# Patient Record
Sex: Female | Born: 1970
Health system: Southern US, Community
[De-identification: ages and names within clinical notes are randomized; demographics above are authoritative.]

## PROBLEM LIST (undated history)

## (undated) DIAGNOSIS — E785 Hyperlipidemia, unspecified: Secondary | ICD-10-CM

## (undated) DIAGNOSIS — C801 Malignant (primary) neoplasm, unspecified: Secondary | ICD-10-CM

## (undated) DIAGNOSIS — I10 Essential (primary) hypertension: Secondary | ICD-10-CM

## (undated) HISTORY — PX: THYROIDECTOMY: SHX17

## (undated) HISTORY — DX: Hyperlipidemia, unspecified: E78.5

## (undated) HISTORY — DX: Essential (primary) hypertension: I10

## (undated) HISTORY — PX: ABDOMINAL HYSTERECTOMY: SHX81

## (undated) HISTORY — DX: Malignant (primary) neoplasm, unspecified: C80.1

## (undated) HISTORY — PX: SALPINGOOPHORECTOMY: SHX82

---

## 2000-06-13 ENCOUNTER — Emergency Department (HOSPITAL_COMMUNITY): Admission: EM | Admit: 2000-06-13 | Discharge: 2000-06-13 | Payer: Self-pay | Admitting: Emergency Medicine

## 2000-08-01 ENCOUNTER — Ambulatory Visit (HOSPITAL_COMMUNITY): Admission: RE | Admit: 2000-08-01 | Discharge: 2000-08-01 | Payer: Self-pay | Admitting: Cardiology

## 2000-08-01 ENCOUNTER — Encounter: Payer: Self-pay | Admitting: Cardiology

## 2000-09-13 ENCOUNTER — Other Ambulatory Visit: Admission: RE | Admit: 2000-09-13 | Discharge: 2000-09-13 | Payer: Self-pay | Admitting: *Deleted

## 2000-11-28 ENCOUNTER — Encounter: Payer: Self-pay | Admitting: Cardiology

## 2000-11-28 ENCOUNTER — Encounter: Admission: RE | Admit: 2000-11-28 | Discharge: 2000-11-28 | Payer: Self-pay | Admitting: Cardiology

## 2000-12-31 ENCOUNTER — Ambulatory Visit (HOSPITAL_COMMUNITY): Admission: RE | Admit: 2000-12-31 | Discharge: 2000-12-31 | Payer: Self-pay | Admitting: Internal Medicine

## 2001-01-01 ENCOUNTER — Encounter: Payer: Self-pay | Admitting: Internal Medicine

## 2002-01-10 ENCOUNTER — Ambulatory Visit (HOSPITAL_COMMUNITY): Admission: RE | Admit: 2002-01-10 | Discharge: 2002-01-10 | Payer: Self-pay | Admitting: Internal Medicine

## 2002-01-10 ENCOUNTER — Encounter: Payer: Self-pay | Admitting: Internal Medicine

## 2002-01-16 ENCOUNTER — Ambulatory Visit (HOSPITAL_COMMUNITY): Admission: RE | Admit: 2002-01-16 | Discharge: 2002-01-16 | Payer: Self-pay | Admitting: Internal Medicine

## 2002-01-16 ENCOUNTER — Encounter: Payer: Self-pay | Admitting: Internal Medicine

## 2003-07-15 ENCOUNTER — Encounter: Admission: RE | Admit: 2003-07-15 | Discharge: 2003-07-15 | Payer: Self-pay | Admitting: Internal Medicine

## 2004-09-15 ENCOUNTER — Encounter: Admission: RE | Admit: 2004-09-15 | Discharge: 2004-09-15 | Payer: Self-pay | Admitting: Internal Medicine

## 2007-01-17 ENCOUNTER — Encounter: Admission: RE | Admit: 2007-01-17 | Discharge: 2007-01-17 | Payer: Self-pay | Admitting: Internal Medicine

## 2007-01-23 ENCOUNTER — Encounter: Admission: RE | Admit: 2007-01-23 | Discharge: 2007-01-23 | Payer: Self-pay | Admitting: Internal Medicine

## 2007-01-23 ENCOUNTER — Other Ambulatory Visit: Admission: RE | Admit: 2007-01-23 | Discharge: 2007-01-23 | Payer: Self-pay | Admitting: Interventional Radiology

## 2007-01-23 ENCOUNTER — Encounter (INDEPENDENT_AMBULATORY_CARE_PROVIDER_SITE_OTHER): Payer: Self-pay | Admitting: Interventional Radiology

## 2007-02-22 ENCOUNTER — Ambulatory Visit (HOSPITAL_COMMUNITY): Admission: RE | Admit: 2007-02-22 | Discharge: 2007-02-23 | Payer: Self-pay | Admitting: Surgery

## 2007-02-22 ENCOUNTER — Encounter (INDEPENDENT_AMBULATORY_CARE_PROVIDER_SITE_OTHER): Payer: Self-pay | Admitting: Surgery

## 2007-03-26 ENCOUNTER — Encounter (HOSPITAL_COMMUNITY): Admission: RE | Admit: 2007-03-26 | Discharge: 2007-06-24 | Payer: Self-pay | Admitting: Endocrinology

## 2007-03-29 ENCOUNTER — Ambulatory Visit (HOSPITAL_COMMUNITY): Admission: RE | Admit: 2007-03-29 | Discharge: 2007-03-29 | Payer: Self-pay | Admitting: Endocrinology

## 2008-05-18 ENCOUNTER — Encounter (HOSPITAL_COMMUNITY): Admission: RE | Admit: 2008-05-18 | Discharge: 2008-07-01 | Payer: Self-pay | Admitting: Internal Medicine

## 2009-05-03 ENCOUNTER — Encounter: Admission: RE | Admit: 2009-05-03 | Discharge: 2009-05-03 | Payer: Self-pay | Admitting: Obstetrics and Gynecology

## 2009-12-26 IMAGING — CR DG CHEST 2V
2 series · 2 of 2 positions shown · non-contrast
Comparison: 11/28/00.

CLINICAL DATA: Preop for thyroid cancer.
 CHEST - 2 VIEW:

[view not recorded (1 of 2)]
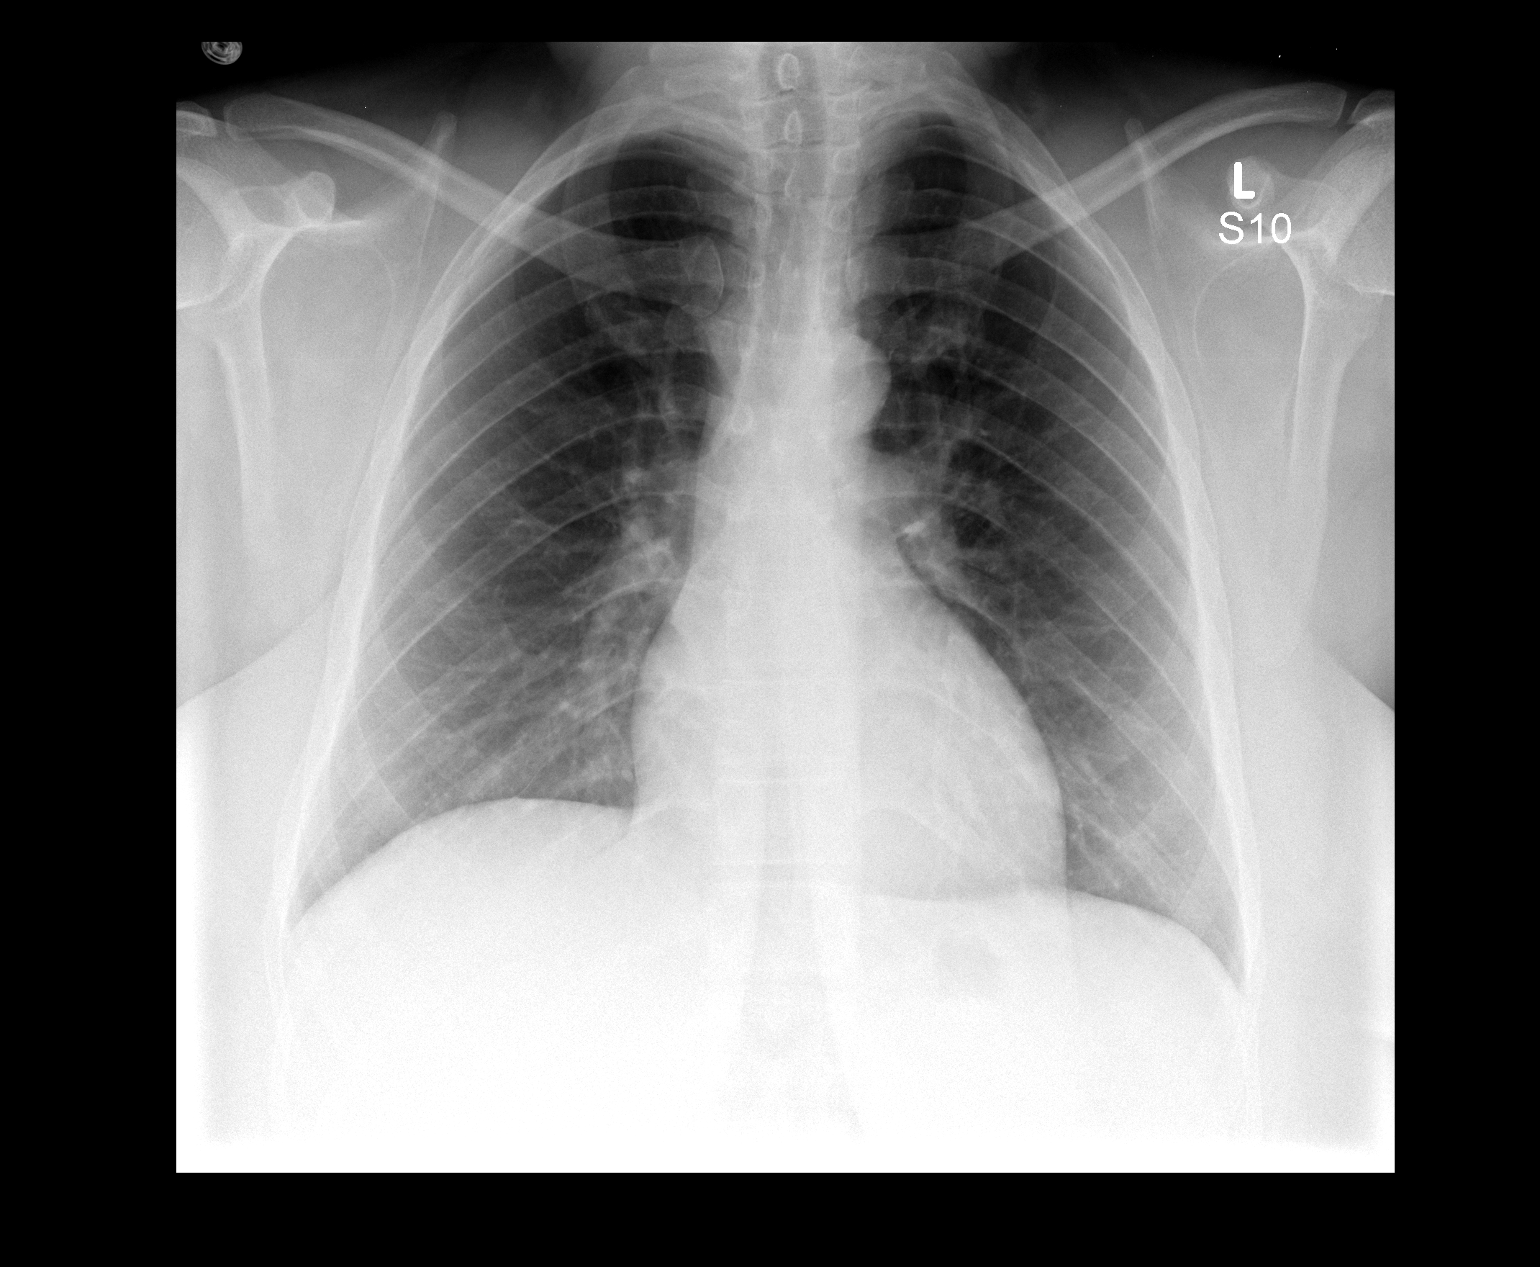

[view not recorded (2 of 2)]
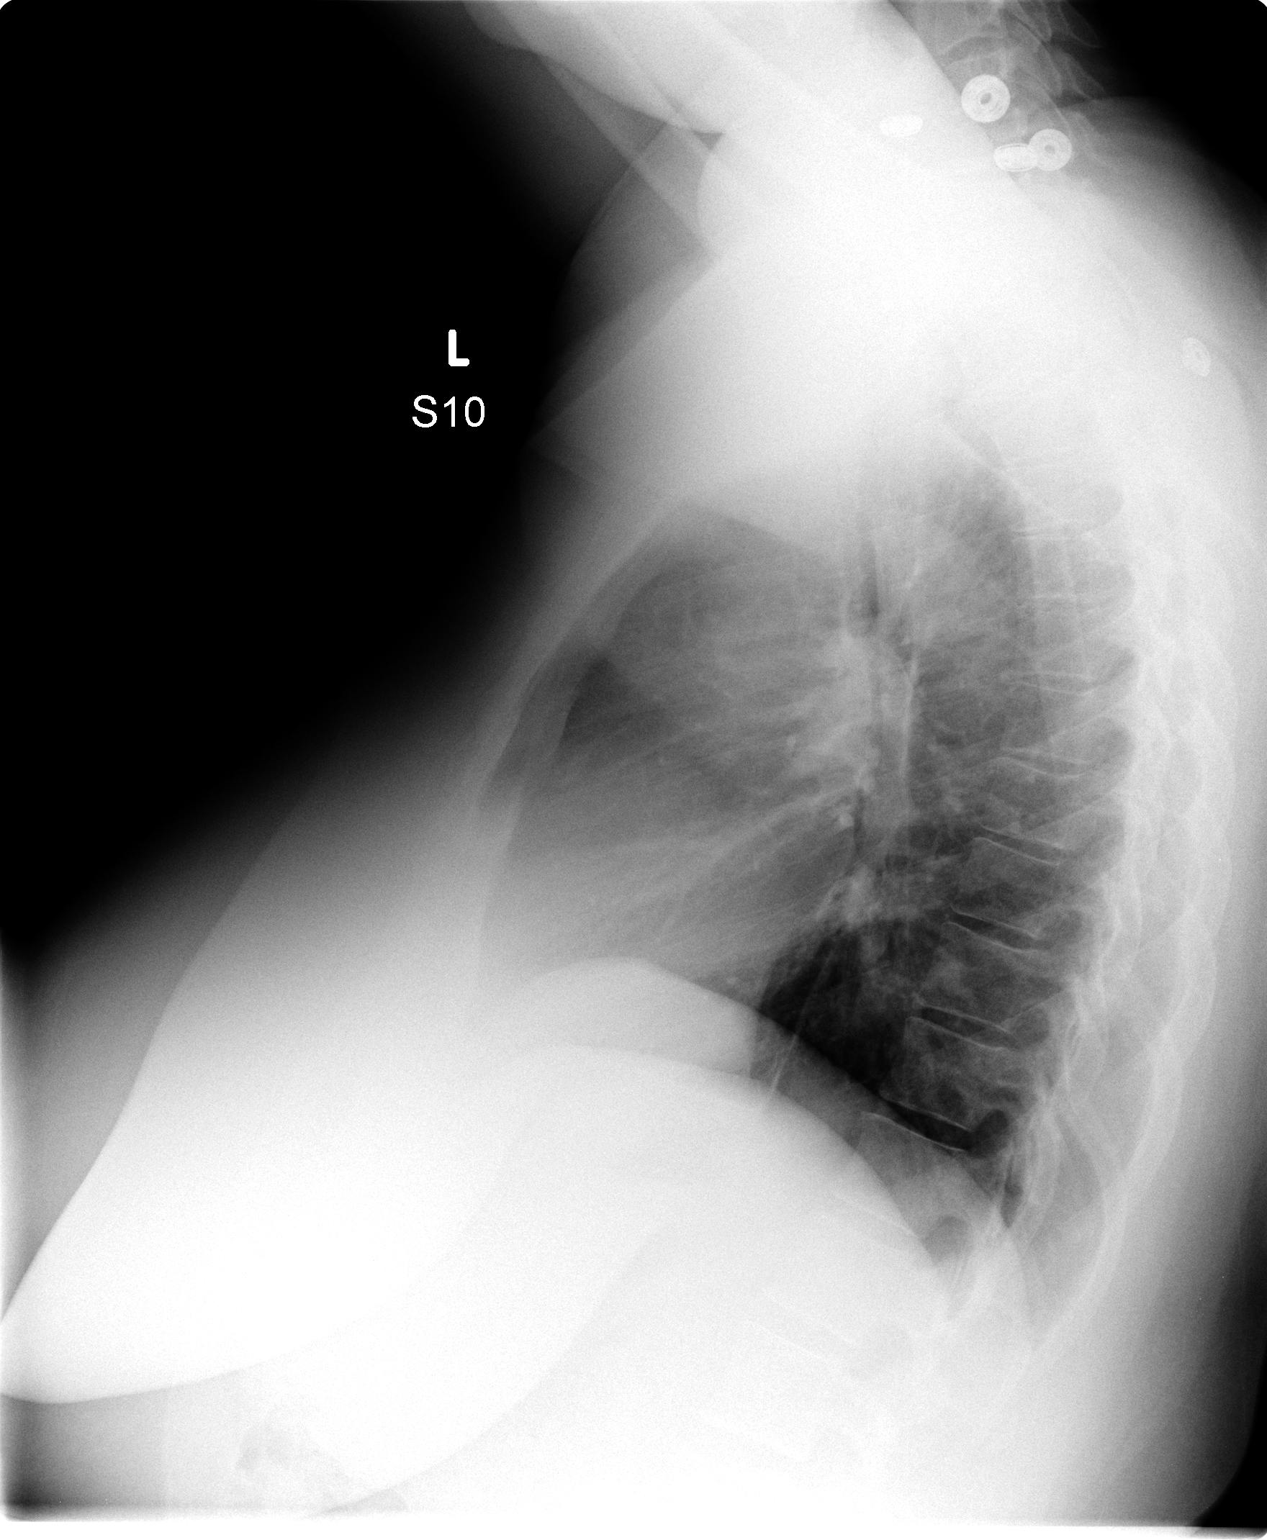

[2 of 2 positions shown; findings below may reference images not displayed]

FINDINGS: The heart size and mediastinal contours are within normal limits.  Both lungs are clear.  The visualized skeletal structures are unremarkable.
IMPRESSION: No active cardiopulmonary disease.

## 2010-04-12 LAB — THYROGLOBULIN LEVEL
Antithyroglobulin Ab: <20 IU/mL (ref <20)
Thyroglobulin: 2.1 ng/mL — ABNORMAL LOW (ref 2.0–35.0)

## 2010-05-17 NOTE — Op Note (Signed)
NAME:  Raven Wagner, Raven Wagner                  ACCOUNT NO.:  [id number]   MEDICAL RECORD NO.:  [id number]          PATIENT TYPE:  OIB   LOCATION:  1521                         FACILITY:  [facility]   PHYSICIAN:  Velora Heckler, MD      DATE OF BIRTH:  24-Dec-1970   DATE OF PROCEDURE:  02/22/2007  DATE OF DISCHARGE:  02/23/2007                               OPERATIVE REPORT   PREOPERATIVE DIAGNOSIS:  Papillary thyroid carcinoma.   POSTOPERATIVE DIAGNOSIS:  Papillary thyroid carcinoma.   PROCEDURE:  1. Total thyroidectomy.  2. Central compartment (Zone VI) lymph node dissection.   SURGEON:  Velora Heckler, M.D., FACS   ASSISTANT:  Clovis Pu. Cornett, M.D., FACS   ANESTHESIA:  General per Dr. Sherrian Divers   ESTIMATED BLOOD LOSS:  Minimal.   PREPARATION:  Betadine.   COMPLICATIONS:  None.   INDICATIONS:  The patient is a 40 year old black female from [place],  [place].  She had a known thyroid goiter since 2004.  She has  been treated with Synthroid and followed.  The patient recently became a  patient of Dr. Creola Corn at [facility]  Dr. Timothy Lasso  palpated a large dominant nodule in the thyroid.  The patient was  referred immediately for fine needle aspiration biopsy which  demonstrated findings consistent with papillary thyroid carcinoma.  The  patient now comes to surgery for thyroidectomy and limited lymph node  dissection.   BODY OF REPORT:  The procedure is done in OR number 6 at the [facility].  The patient is brought to the operating room and placed in a  supine position on the operating room table.  Following administration  of general anesthesia, the patient is positioned and then prepped and  draped in the usual strict aseptic fashion.  After ascertaining that an  adequate level of anesthesia had been achieved, a Kocher incision was  made with a #15 blade.  Dissection is carried through subcutaneous  tissues and platysma.  Hemostasis was obtained  with electrocautery.  Skin flaps were elevated cephalad and caudad from the thyroid notch to  the sternal notch.  A Mahorner self-retaining retractor was placed for  exposure.  Strap muscles were incised in the midline and dissection is  begun on the left side of the neck.  Strap muscles were reflected  laterally.  The tumor appears to be located largely in the isthmus.  It  is fairly bulky and somewhat adherent to the underlying tissues.  The  left lobe was relatively normal.  Strap muscles are reflected laterally.  Venous tributaries were divided between small and medium Liga clips with  the harmonic scalpel.  The superior pole vessels are dissected out and  ligated in continuity with 2-0 silk ties and medium Liga clips and  divided with the harmonic scalpel.  The inferior venous tributaries were  dissected out individually and divided between small Liga clips with the  harmonic scalpel.  The gland is rolled anteriorly.  Both the superior  and inferior parathyroid glands were identified.  The inferior  parathyroid  gland is on the capsule of the thyroid.  It is dissected off  on a vascular pedicle.  It appears that perfusion is maintained and the  gland was left in situ.  The recurrent nerve was identified and  preserved.  Branches of the inferior thyroid artery are divided between  small Liga clips.  The ligament of Allyson Sabal is transected with the  electrocautery and the gland is rolled onto the anterior trachea.  However, the isthmus is densely adherent to the underlying trachea and  is difficult to mobilize even with the use of the electrocautery.  Care  was taken to avoid tracheal injury.   Next, we turned our attention to the right thyroid lobe.  Again, strap  muscles are reflected laterally.  However, strap muscles were densely  adherent at one point to the capsule of the thyroid gland.  Therefore, a  small segment of the anterior strap muscles are resected en bloc with  the thyroid.   The remaining strap muscles are reflected laterally and  the right lobe was exposed.  The middle vein is divided between medium  Liga clips with the harmonic scalpel.  Inferior venous tributaries were  divided between small and medium Liga clips with the harmonic scalpel.  The superior pole vessels were dissected out and divided between medium  Liga clips with the harmonic scalpel.  There is no significant pyramidal  lobe.  The gland is rolled anteriorly.  Both superior and inferior  parathyroid glands were identified.  The recurrent nerve was identified  and preserved.  The branches of the inferior thyroid artery are divided  between small Liga clips with the electrocautery and the harmonic  scalpel.  The ligament of Allyson Sabal is transected with the electrocautery  and the gland was mobilized again towards the anterior trachea.  There  appears to be direct tumor invasion into the underlying trachea.  No  discernible plane exists between the tumor and the cartilage.  With  judicious use of the electrocautery on a low setting, the gland is  mobilized off the anterior trachea without damaging the underlying  cartilage.  The gland is completely resected, although there is likely a  grossly positive margin at the trachea.  A suture is used to mark the  right superior pole and the entire thyroid was submitted to pathology  for review.  No gross tumor remains in the operative field.   Next, the central compartment zone 6 lymph nodes are excised.  The  tissue was mobilized along the right side of the trachea at the level of  the recurrent nerve which is visualized along its course.  Dissection  was carried inferiorly to the dominant artery at its division with the  right carotid.  The vascular structures were transected, at this point,  with the harmonic scalpel between medium Liga clips.  The left side of  the zone 6 lymph node bearing tissue is resected off of the left lateral  neck, taking care  to avoid the left carotid and the recurrent nerve.  Dissection is carried down to the innominate and remaining tissue  completely excised.  There are several approximately 4-5 mm lymph nodes  present.  They did not grossly contain tumor.  The lymph node bearing  tissue was submitted to pathology for review.  Good hemostasis is noted.  The neck is irrigated with warm saline.  Surgicel was placed in the  operative field bilaterally.  The strap muscles were reapproximated in  the midline with  interrupted 3-0 Vicryl sutures.  The platysma was  closed with interrupted 3-0 Vicryl sutures.  The skin was closed with a  running 4-0 Monocryl  subcuticular suture.  The wound is washed and dried and Benzoin and  Steri-Strips are applied.  Sterile dressings were applied.  The patient  is awakened from anesthesia and brought to the recovery room in stable  condition.  The patient tolerated the procedure well.      Velora Heckler, MD  Electronically Signed     TMG/MEDQ  D:  02/22/2007  T:  02/23/2007  Job:  [id number]   cc:   Gwen Pounds, MD  Fax: [phone]   Lenoard Aden, M.D.  Fax: [phone]

## 2010-06-30 ENCOUNTER — Other Ambulatory Visit: Payer: Self-pay | Admitting: Obstetrics and Gynecology

## 2010-06-30 DIAGNOSIS — Z1231 Encounter for screening mammogram for malignant neoplasm of breast: Secondary | ICD-10-CM

## 2010-07-05 ENCOUNTER — Other Ambulatory Visit: Payer: Self-pay | Admitting: Obstetrics and Gynecology

## 2010-07-05 ENCOUNTER — Ambulatory Visit
Admission: RE | Admit: 2010-07-05 | Discharge: 2010-07-05 | Disposition: A | Discharge status: Home / Self Care | Payer: Private Health Insurance - Indemnity | Source: Ambulatory Visit | Attending: Obstetrics and Gynecology | Admitting: Obstetrics and Gynecology

## 2010-07-05 DIAGNOSIS — Z1231 Encounter for screening mammogram for malignant neoplasm of breast: Secondary | ICD-10-CM

## 2010-07-05 DIAGNOSIS — R928 Other abnormal and inconclusive findings on diagnostic imaging of breast: Secondary | ICD-10-CM

## 2010-07-13 ENCOUNTER — Ambulatory Visit
Admission: RE | Admit: 2010-07-13 | Discharge: 2010-07-13 | Disposition: A | Discharge status: Home / Self Care | Payer: Private Health Insurance - Indemnity | Source: Ambulatory Visit | Attending: Obstetrics and Gynecology | Admitting: Obstetrics and Gynecology

## 2010-07-13 DIAGNOSIS — R928 Other abnormal and inconclusive findings on diagnostic imaging of breast: Secondary | ICD-10-CM

## 2010-09-23 LAB — BASIC METABOLIC PANEL
BUN: 15
Calcium: 10
Chloride: 102
Creatinine, Ser: 0.91
GFR calc Af Amer: >60
Potassium: 3.9

## 2010-09-23 LAB — DIFFERENTIAL
Basophils Absolute: 0.3 — ABNORMAL HIGH
Basophils Relative: 4 — ABNORMAL HIGH
Eosinophils Relative: 1
Lymphocytes Relative: 40
Lymphs Abs: 2.5
Monocytes Absolute: 0.5
Monocytes Relative: 8
Neutro Abs: 2.9
Neutrophils Relative %: 47

## 2010-09-23 LAB — URINALYSIS, ROUTINE W REFLEX MICROSCOPIC
Bilirubin Urine: NEGATIVE
Hgb urine dipstick: NEGATIVE
Urobilinogen, UA: 0.2

## 2010-09-23 LAB — CBC
Hemoglobin: 13
Platelets: 271
RBC: 4.44
RDW: 14

## 2011-08-29 ENCOUNTER — Other Ambulatory Visit: Payer: Self-pay | Admitting: Obstetrics and Gynecology

## 2011-08-29 DIAGNOSIS — Z1231 Encounter for screening mammogram for malignant neoplasm of breast: Secondary | ICD-10-CM

## 2011-09-19 ENCOUNTER — Ambulatory Visit
Admission: RE | Admit: 2011-09-19 | Discharge: 2011-09-19 | Disposition: A | Discharge status: Home / Self Care | Payer: Managed Care, Other (non HMO) | Source: Ambulatory Visit | Attending: Obstetrics and Gynecology | Admitting: Obstetrics and Gynecology

## 2011-09-19 DIAGNOSIS — Z1231 Encounter for screening mammogram for malignant neoplasm of breast: Secondary | ICD-10-CM

## 2011-09-21 ENCOUNTER — Other Ambulatory Visit: Payer: Self-pay | Admitting: Obstetrics and Gynecology

## 2011-09-21 DIAGNOSIS — R928 Other abnormal and inconclusive findings on diagnostic imaging of breast: Secondary | ICD-10-CM

## 2011-09-22 ENCOUNTER — Ambulatory Visit
Admission: RE | Admit: 2011-09-22 | Discharge: 2011-09-22 | Disposition: A | Discharge status: Home / Self Care | Payer: Managed Care, Other (non HMO) | Source: Ambulatory Visit | Attending: Obstetrics and Gynecology | Admitting: Obstetrics and Gynecology

## 2011-09-22 DIAGNOSIS — R928 Other abnormal and inconclusive findings on diagnostic imaging of breast: Secondary | ICD-10-CM

## 2011-09-25 ENCOUNTER — Other Ambulatory Visit: Payer: Managed Care, Other (non HMO)

## 2012-11-15 ENCOUNTER — Other Ambulatory Visit: Payer: Self-pay

## 2012-11-15 DIAGNOSIS — Z1231 Encounter for screening mammogram for malignant neoplasm of breast: Secondary | ICD-10-CM

## 2012-12-13 ENCOUNTER — Ambulatory Visit
Admission: RE | Admit: 2012-12-13 | Discharge: 2012-12-13 | Disposition: A | Discharge status: Home / Self Care | Payer: Managed Care, Other (non HMO) | Source: Ambulatory Visit

## 2012-12-13 DIAGNOSIS — Z1231 Encounter for screening mammogram for malignant neoplasm of breast: Secondary | ICD-10-CM

## 2013-11-18 ENCOUNTER — Other Ambulatory Visit: Payer: Self-pay

## 2013-11-18 DIAGNOSIS — Z1231 Encounter for screening mammogram for malignant neoplasm of breast: Secondary | ICD-10-CM

## 2013-12-18 ENCOUNTER — Ambulatory Visit
Admission: RE | Admit: 2013-12-18 | Discharge: 2013-12-18 | Disposition: A | Discharge status: Home / Self Care | Payer: Private Health Insurance - Indemnity | Source: Ambulatory Visit

## 2013-12-18 DIAGNOSIS — Z1231 Encounter for screening mammogram for malignant neoplasm of breast: Secondary | ICD-10-CM

## 2015-06-14 DIAGNOSIS — Z6834 Body mass index (BMI) 34.0-34.9, adult: Secondary | ICD-10-CM | POA: Diagnosis not present

## 2015-06-14 DIAGNOSIS — Z01419 Encounter for gynecological examination (general) (routine) without abnormal findings: Secondary | ICD-10-CM | POA: Diagnosis not present

## 2015-06-14 DIAGNOSIS — Z1231 Encounter for screening mammogram for malignant neoplasm of breast: Secondary | ICD-10-CM | POA: Diagnosis not present

## 2015-09-23 DIAGNOSIS — C61 Malignant neoplasm of prostate: Secondary | ICD-10-CM | POA: Diagnosis not present

## 2015-09-24 DIAGNOSIS — F3289 Other specified depressive episodes: Secondary | ICD-10-CM | POA: Diagnosis not present

## 2015-09-24 DIAGNOSIS — E784 Other hyperlipidemia: Secondary | ICD-10-CM | POA: Diagnosis not present

## 2015-09-24 DIAGNOSIS — R7309 Other abnormal glucose: Secondary | ICD-10-CM | POA: Diagnosis not present

## 2015-09-24 DIAGNOSIS — E038 Other specified hypothyroidism: Secondary | ICD-10-CM | POA: Diagnosis not present

## 2016-01-13 DIAGNOSIS — L300 Nummular dermatitis: Secondary | ICD-10-CM | POA: Diagnosis not present

## 2016-02-07 DIAGNOSIS — J111 Influenza due to unidentified influenza virus with other respiratory manifestations: Secondary | ICD-10-CM | POA: Diagnosis not present

## 2016-03-03 DIAGNOSIS — C73 Malignant neoplasm of thyroid gland: Secondary | ICD-10-CM | POA: Diagnosis not present

## 2016-03-03 DIAGNOSIS — I10 Essential (primary) hypertension: Secondary | ICD-10-CM | POA: Diagnosis not present

## 2016-03-03 DIAGNOSIS — E559 Vitamin D deficiency, unspecified: Secondary | ICD-10-CM | POA: Diagnosis not present

## 2016-03-03 DIAGNOSIS — R7309 Other abnormal glucose: Secondary | ICD-10-CM | POA: Diagnosis not present

## 2016-03-10 DIAGNOSIS — Z0000 Encounter for general adult medical examination without abnormal findings: Secondary | ICD-10-CM | POA: Diagnosis not present

## 2016-03-10 DIAGNOSIS — E048 Other specified nontoxic goiter: Secondary | ICD-10-CM | POA: Diagnosis not present

## 2016-03-10 DIAGNOSIS — R7309 Other abnormal glucose: Secondary | ICD-10-CM | POA: Diagnosis not present

## 2016-03-10 DIAGNOSIS — Z23 Encounter for immunization: Secondary | ICD-10-CM | POA: Diagnosis not present

## 2016-03-10 DIAGNOSIS — E038 Other specified hypothyroidism: Secondary | ICD-10-CM | POA: Diagnosis not present

## 2016-03-10 DIAGNOSIS — C73 Malignant neoplasm of thyroid gland: Secondary | ICD-10-CM | POA: Diagnosis not present

## 2016-07-14 DIAGNOSIS — Z01419 Encounter for gynecological examination (general) (routine) without abnormal findings: Secondary | ICD-10-CM | POA: Diagnosis not present

## 2016-07-14 DIAGNOSIS — Z1231 Encounter for screening mammogram for malignant neoplasm of breast: Secondary | ICD-10-CM | POA: Diagnosis not present

## 2016-07-14 DIAGNOSIS — Z6834 Body mass index (BMI) 34.0-34.9, adult: Secondary | ICD-10-CM | POA: Diagnosis not present

## 2016-07-17 DIAGNOSIS — B373 Candidiasis of vulva and vagina: Secondary | ICD-10-CM | POA: Diagnosis not present

## 2016-07-17 DIAGNOSIS — J018 Other acute sinusitis: Secondary | ICD-10-CM | POA: Diagnosis not present

## 2016-11-02 DIAGNOSIS — Z23 Encounter for immunization: Secondary | ICD-10-CM | POA: Diagnosis not present

## 2016-11-02 DIAGNOSIS — C73 Malignant neoplasm of thyroid gland: Secondary | ICD-10-CM | POA: Diagnosis not present

## 2016-11-02 DIAGNOSIS — R21 Rash and other nonspecific skin eruption: Secondary | ICD-10-CM | POA: Diagnosis not present

## 2016-11-02 DIAGNOSIS — I10 Essential (primary) hypertension: Secondary | ICD-10-CM | POA: Diagnosis not present

## 2016-11-02 DIAGNOSIS — E038 Other specified hypothyroidism: Secondary | ICD-10-CM | POA: Diagnosis not present

## 2016-12-06 DIAGNOSIS — J019 Acute sinusitis, unspecified: Secondary | ICD-10-CM | POA: Diagnosis not present

## 2016-12-06 DIAGNOSIS — J3089 Other allergic rhinitis: Secondary | ICD-10-CM | POA: Diagnosis not present

## 2017-01-08 DIAGNOSIS — J069 Acute upper respiratory infection, unspecified: Secondary | ICD-10-CM | POA: Diagnosis not present

## 2017-03-08 DIAGNOSIS — E559 Vitamin D deficiency, unspecified: Secondary | ICD-10-CM | POA: Diagnosis not present

## 2017-03-08 DIAGNOSIS — I10 Essential (primary) hypertension: Secondary | ICD-10-CM | POA: Diagnosis not present

## 2017-03-08 DIAGNOSIS — R7309 Other abnormal glucose: Secondary | ICD-10-CM | POA: Diagnosis not present

## 2017-03-08 DIAGNOSIS — R82998 Other abnormal findings in urine: Secondary | ICD-10-CM | POA: Diagnosis not present

## 2017-03-08 DIAGNOSIS — E048 Other specified nontoxic goiter: Secondary | ICD-10-CM | POA: Diagnosis not present

## 2017-03-08 DIAGNOSIS — Z0000 Encounter for general adult medical examination without abnormal findings: Secondary | ICD-10-CM | POA: Diagnosis not present

## 2017-03-15 DIAGNOSIS — C73 Malignant neoplasm of thyroid gland: Secondary | ICD-10-CM | POA: Diagnosis not present

## 2017-03-15 DIAGNOSIS — Z6833 Body mass index (BMI) 33.0-33.9, adult: Secondary | ICD-10-CM | POA: Diagnosis not present

## 2017-03-15 DIAGNOSIS — Z0000 Encounter for general adult medical examination without abnormal findings: Secondary | ICD-10-CM | POA: Diagnosis not present

## 2017-03-15 DIAGNOSIS — Z1389 Encounter for screening for other disorder: Secondary | ICD-10-CM | POA: Diagnosis not present

## 2017-03-15 DIAGNOSIS — M19019 Primary osteoarthritis, unspecified shoulder: Secondary | ICD-10-CM | POA: Diagnosis not present

## 2017-03-15 DIAGNOSIS — E038 Other specified hypothyroidism: Secondary | ICD-10-CM | POA: Diagnosis not present

## 2017-03-15 DIAGNOSIS — E048 Other specified nontoxic goiter: Secondary | ICD-10-CM | POA: Diagnosis not present

## 2017-03-22 DIAGNOSIS — Z1212 Encounter for screening for malignant neoplasm of rectum: Secondary | ICD-10-CM | POA: Diagnosis not present

## 2017-06-29 DIAGNOSIS — H1013 Acute atopic conjunctivitis, bilateral: Secondary | ICD-10-CM | POA: Diagnosis not present

## 2017-09-05 DIAGNOSIS — Z01419 Encounter for gynecological examination (general) (routine) without abnormal findings: Secondary | ICD-10-CM | POA: Diagnosis not present

## 2017-09-05 DIAGNOSIS — Z6834 Body mass index (BMI) 34.0-34.9, adult: Secondary | ICD-10-CM | POA: Diagnosis not present

## 2017-09-05 DIAGNOSIS — Z1231 Encounter for screening mammogram for malignant neoplasm of breast: Secondary | ICD-10-CM | POA: Diagnosis not present

## 2017-09-11 DIAGNOSIS — I10 Essential (primary) hypertension: Secondary | ICD-10-CM | POA: Diagnosis not present

## 2017-09-11 DIAGNOSIS — Z1389 Encounter for screening for other disorder: Secondary | ICD-10-CM | POA: Diagnosis not present

## 2017-09-11 DIAGNOSIS — E038 Other specified hypothyroidism: Secondary | ICD-10-CM | POA: Diagnosis not present

## 2017-09-11 DIAGNOSIS — Z23 Encounter for immunization: Secondary | ICD-10-CM | POA: Diagnosis not present

## 2017-09-11 DIAGNOSIS — Z6833 Body mass index (BMI) 33.0-33.9, adult: Secondary | ICD-10-CM | POA: Diagnosis not present

## 2017-09-11 DIAGNOSIS — E668 Other obesity: Secondary | ICD-10-CM | POA: Diagnosis not present

## 2017-11-04 DIAGNOSIS — J069 Acute upper respiratory infection, unspecified: Secondary | ICD-10-CM | POA: Diagnosis not present

## 2017-11-04 DIAGNOSIS — I10 Essential (primary) hypertension: Secondary | ICD-10-CM | POA: Diagnosis not present

## 2017-11-04 DIAGNOSIS — J209 Acute bronchitis, unspecified: Secondary | ICD-10-CM | POA: Diagnosis not present

## 2017-11-08 DIAGNOSIS — J0100 Acute maxillary sinusitis, unspecified: Secondary | ICD-10-CM | POA: Diagnosis not present

## 2017-11-08 DIAGNOSIS — R05 Cough: Secondary | ICD-10-CM | POA: Diagnosis not present

## 2017-11-08 DIAGNOSIS — B373 Candidiasis of vulva and vagina: Secondary | ICD-10-CM | POA: Diagnosis not present

## 2018-03-08 DIAGNOSIS — Z0000 Encounter for general adult medical examination without abnormal findings: Secondary | ICD-10-CM | POA: Diagnosis not present

## 2018-03-08 DIAGNOSIS — E559 Vitamin D deficiency, unspecified: Secondary | ICD-10-CM | POA: Diagnosis not present

## 2018-03-08 DIAGNOSIS — R7309 Other abnormal glucose: Secondary | ICD-10-CM | POA: Diagnosis not present

## 2018-03-08 DIAGNOSIS — E049 Nontoxic goiter, unspecified: Secondary | ICD-10-CM | POA: Diagnosis not present

## 2018-03-08 DIAGNOSIS — I10 Essential (primary) hypertension: Secondary | ICD-10-CM | POA: Diagnosis not present

## 2018-03-08 DIAGNOSIS — R82998 Other abnormal findings in urine: Secondary | ICD-10-CM | POA: Diagnosis not present

## 2018-03-18 DIAGNOSIS — E048 Other specified nontoxic goiter: Secondary | ICD-10-CM | POA: Diagnosis not present

## 2018-03-18 DIAGNOSIS — E038 Other specified hypothyroidism: Secondary | ICD-10-CM | POA: Diagnosis not present

## 2018-03-18 DIAGNOSIS — Z1331 Encounter for screening for depression: Secondary | ICD-10-CM | POA: Diagnosis not present

## 2018-03-18 DIAGNOSIS — I10 Essential (primary) hypertension: Secondary | ICD-10-CM | POA: Diagnosis not present

## 2018-03-18 DIAGNOSIS — Z0000 Encounter for general adult medical examination without abnormal findings: Secondary | ICD-10-CM | POA: Diagnosis not present

## 2018-03-18 DIAGNOSIS — E7849 Other hyperlipidemia: Secondary | ICD-10-CM | POA: Diagnosis not present

## 2018-03-19 DIAGNOSIS — Z1212 Encounter for screening for malignant neoplasm of rectum: Secondary | ICD-10-CM | POA: Diagnosis not present

## 2018-12-03 DIAGNOSIS — Z01419 Encounter for gynecological examination (general) (routine) without abnormal findings: Secondary | ICD-10-CM | POA: Diagnosis not present

## 2018-12-03 DIAGNOSIS — Z1231 Encounter for screening mammogram for malignant neoplasm of breast: Secondary | ICD-10-CM | POA: Diagnosis not present

## 2018-12-03 DIAGNOSIS — Z6836 Body mass index (BMI) 36.0-36.9, adult: Secondary | ICD-10-CM | POA: Diagnosis not present

## 2019-02-23 DIAGNOSIS — R0602 Shortness of breath: Secondary | ICD-10-CM | POA: Diagnosis not present

## 2019-02-23 DIAGNOSIS — R0902 Hypoxemia: Secondary | ICD-10-CM | POA: Diagnosis not present

## 2019-02-23 DIAGNOSIS — U071 COVID-19: Secondary | ICD-10-CM | POA: Diagnosis not present

## 2019-02-23 DIAGNOSIS — R438 Other disturbances of smell and taste: Secondary | ICD-10-CM | POA: Diagnosis not present

## 2019-02-23 DIAGNOSIS — J189 Pneumonia, unspecified organism: Secondary | ICD-10-CM | POA: Diagnosis not present

## 2019-02-23 DIAGNOSIS — J9811 Atelectasis: Secondary | ICD-10-CM | POA: Diagnosis not present

## 2019-02-25 ENCOUNTER — Ambulatory Visit (HOSPITAL_COMMUNITY): Payer: Private Health Insurance - Indemnity

## 2019-02-25 ENCOUNTER — Other Ambulatory Visit: Payer: Self-pay | Admitting: Nurse Practitioner

## 2019-02-25 ENCOUNTER — Ambulatory Visit (HOSPITAL_COMMUNITY)
Admission: RE | Admit: 2019-02-25 | Discharge: 2019-02-25 | Disposition: A | Discharge status: Home / Self Care | Payer: BC Managed Care – PPO | Source: Ambulatory Visit | Attending: Pulmonary Disease | Admitting: Pulmonary Disease

## 2019-02-25 DIAGNOSIS — Z6836 Body mass index (BMI) 36.0-36.9, adult: Secondary | ICD-10-CM | POA: Insufficient documentation

## 2019-02-25 DIAGNOSIS — E6609 Other obesity due to excess calories: Secondary | ICD-10-CM | POA: Diagnosis not present

## 2019-02-25 DIAGNOSIS — U071 COVID-19: Secondary | ICD-10-CM

## 2019-02-25 MED ORDER — METHYLPREDNISOLONE SODIUM SUCC 125 MG IJ SOLR: 125.0000 mg | Freq: Once | INTRAMUSCULAR | Status: DC | PRN

## 2019-02-25 MED ORDER — ALBUTEROL SULFATE HFA 108 (90 BASE) MCG/ACT IN AERS: 2.0000 | INHALATION_SPRAY | Freq: Once | RESPIRATORY_TRACT | Status: DC | PRN

## 2019-02-25 MED ORDER — SODIUM CHLORIDE 0.9 % IV SOLN
700.0000 mg | Freq: Once | INTRAVENOUS | Status: AC
  Administered 2019-02-25: 700 mg via INTRAVENOUS
  Filled 2019-02-25: qty 20

## 2019-02-25 MED ORDER — SODIUM CHLORIDE 0.9 % IV SOLN
INTRAVENOUS | Status: DC | PRN
  Administered 2019-02-25: 250 mL via INTRAVENOUS

## 2019-02-25 MED ORDER — EPINEPHRINE 0.3 MG/0.3ML IJ SOAJ: 0.3000 mg | Freq: Once | INTRAMUSCULAR | Status: DC | PRN

## 2019-02-25 MED ORDER — DIPHENHYDRAMINE HCL 50 MG/ML IJ SOLN: 50.0000 mg | Freq: Once | INTRAMUSCULAR | Status: DC | PRN

## 2019-02-25 MED ORDER — FAMOTIDINE IN NACL 20-0.9 MG/50ML-% IV SOLN: 20.0000 mg | Freq: Once | INTRAVENOUS | Status: DC | PRN

## 2019-02-25 NOTE — Discharge Instructions (Signed)

## 2019-02-25 NOTE — Progress Notes (Signed)
  I connected by phone with Lysbeth Galas on 02/25/2019 at 9:50 AM to discuss the potential use of an new treatment for mild to moderate COVID-19 viral infection in non-hospitalized patients.  This patient is a 49 y.o. female that meets the FDA criteria for Emergency Use Authorization of bamlanivimab or casirivimab\imdevimab.  Has a (+) direct SARS-CoV-2 viral test result  Has mild or moderate COVID-19   Is ? 48 years of age and weighs ? 40 kg  Is NOT hospitalized due to COVID-19  Is NOT requiring oxygen therapy or requiring an increase in baseline oxygen flow rate due to COVID-19  Is within 10 days of symptom onset  Has at least one of the high risk factor(s) for progression to severe COVID-19 and/or hospitalization as defined in EUA.  Specific high risk criteria : BMI >/= 35   I have spoken and communicated the following to the patient or parent/caregiver:  1. FDA has authorized the emergency use of bamlanivimab and casirivimab\imdevimab for the treatment of mild to moderate COVID-19 in adults and pediatric patients with positive results of direct SARS-CoV-2 viral testing who are 7 years of age and older weighing at least 40 kg, and who are at high risk for progressing to severe COVID-19 and/or hospitalization.  2. The significant known and potential risks and benefits of bamlanivimab and casirivimab\imdevimab, and the extent to which such potential risks and benefits are unknown.  3. Information on available alternative treatments and the risks and benefits of those alternatives, including clinical trials.  4. Patients treated with bamlanivimab and casirivimab\imdevimab should continue to self-isolate and use infection control measures (e.g., wear mask, isolate, social distance, avoid sharing personal items, clean and disinfect "high touch" surfaces, and frequent handwashing) according to CDC guidelines.   5. The patient or parent/caregiver has the option to accept or refuse  bamlanivimab or casirivimab\imdevimab .  After reviewing this information with the patient, The patient agreed to proceed with receiving the bamlanimivab infusion and will be provided a copy of the Fact sheet prior to receiving the infusion.Fenton Foy 02/25/2019 9:50 AM

## 2019-02-25 NOTE — Progress Notes (Signed)
  Diagnosis: COVID-19  Physician: Dr. Joya Gaskins  Procedure: [facility] Med: bamlanivimab infusion - Provided patient with bamlanimivab fact sheet for patients, parents and caregivers prior to infusion.  Complications: No immediate complications noted.  Discharge: Discharged home   Babs Sciara 02/25/2019

## 2019-03-13 DIAGNOSIS — E039 Hypothyroidism, unspecified: Secondary | ICD-10-CM | POA: Diagnosis not present

## 2019-03-13 DIAGNOSIS — E559 Vitamin D deficiency, unspecified: Secondary | ICD-10-CM | POA: Diagnosis not present

## 2019-03-13 DIAGNOSIS — Z0000 Encounter for general adult medical examination without abnormal findings: Secondary | ICD-10-CM | POA: Diagnosis not present

## 2019-03-13 DIAGNOSIS — E7849 Other hyperlipidemia: Secondary | ICD-10-CM | POA: Diagnosis not present

## 2019-03-20 DIAGNOSIS — Z8616 Personal history of COVID-19: Secondary | ICD-10-CM | POA: Diagnosis not present

## 2019-03-20 DIAGNOSIS — R21 Rash and other nonspecific skin eruption: Secondary | ICD-10-CM | POA: Diagnosis not present

## 2019-03-20 DIAGNOSIS — E049 Nontoxic goiter, unspecified: Secondary | ICD-10-CM | POA: Diagnosis not present

## 2019-03-20 DIAGNOSIS — I10 Essential (primary) hypertension: Secondary | ICD-10-CM | POA: Diagnosis not present

## 2019-03-20 DIAGNOSIS — R739 Hyperglycemia, unspecified: Secondary | ICD-10-CM | POA: Diagnosis not present

## 2019-03-20 DIAGNOSIS — R062 Wheezing: Secondary | ICD-10-CM | POA: Diagnosis not present

## 2019-03-20 DIAGNOSIS — Z0000 Encounter for general adult medical examination without abnormal findings: Secondary | ICD-10-CM | POA: Diagnosis not present

## 2019-03-20 DIAGNOSIS — E669 Obesity, unspecified: Secondary | ICD-10-CM | POA: Diagnosis not present

## 2019-03-28 DIAGNOSIS — Z1212 Encounter for screening for malignant neoplasm of rectum: Secondary | ICD-10-CM | POA: Diagnosis not present

## 2019-05-08 DIAGNOSIS — Z20822 Contact with and (suspected) exposure to covid-19: Secondary | ICD-10-CM | POA: Diagnosis not present

## 2019-05-12 ENCOUNTER — Ambulatory Visit: Payer: BC Managed Care – PPO | Admitting: Skilled Nursing Facility1

## 2019-05-26 ENCOUNTER — Encounter: Payer: BC Managed Care – PPO | Attending: Internal Medicine | Admitting: Dietician

## 2019-05-26 ENCOUNTER — Other Ambulatory Visit: Payer: Self-pay

## 2019-05-26 DIAGNOSIS — E669 Obesity, unspecified: Secondary | ICD-10-CM | POA: Diagnosis not present

## 2019-05-26 NOTE — Patient Instructions (Signed)
Remember to include snacks throughout the day as needed. Try to have a source of carbohydrates and protein with your snacks. Use snacks when you feel hungry in between meals.

## 2019-05-26 NOTE — Progress Notes (Signed)
Medical Nutrition Therapy   Primary concerns today: weight management, prediabetes   Referral diagnoses: E66.9- obesity;  R73.9- hyperglycemia  Preferred learning style: no preference indicated Learning readiness: ready   NUTRITION ASSESSMENT   Anthropometrics  Weight: 204.8 lbs    Clinical Labs: A1c 6.4%   Lifestyle & Dietary Hx Patient states she would like to lose some weight to help manage her blood sugar and prevent the development of diabetes. States she has tried [employer] and [employer] in the past. States she has lactose intolerance, and avoids cooked/warm oatmeal due to heartburn. States she has been avoiding bagels, bread, rice and pasta for about the past month and focusing on high protein intake. Typical meal pattern is 3 meals plus 1 snack per day. May have meatballs, grilled chicken, a salad, tacos for meals. Snacks include 1/2 serving of gelato, pretzels and hummus. States she is busy throughout the day with work and is rarely able to take snack breaks, and that she is really hungry by the time she gets home in the evenings. Will order out/have food delivered throughout the week in the evenings.   Estimated daily fluid intake: 1 L  Supplements: vitamin D, Women's One a Day MVI Sleep: restless sleep d/t sciatic nerve pain ~6 hrs/night  Stress / self-care: stress eater, was more stressed during COVID especially  Current average weekly physical activity: walking 2 days  24-Hr Dietary Recall First Meal: 2 [place] sausage + 2 boiled eggs + cheese  Snack: - Second Meal: burger + 1/2 bag of chips  Snack: - Third Meal: fried chicken salad + ranch dressing  Snack: [employer]  Beverages: water, Coke   Estimated Energy Needs Calories: 1600-1800 Carbohydrate: 180-200g Protein: 100-113g Fat: 53-60g   NUTRITION DIAGNOSIS  Food and nutrition related knowledge deficit (NB-1.1) related to imbalance of carbs/protein/fat intake as evidenced by patient  reported dietary hx and referral for nutrition education.    NUTRITION INTERVENTION  Nutrition education (E-1) on the following topics:  . General healthful eating  . Balanced meals/snacks for blood glucose control  Handouts Provided Include   MyPlate + Meal Ideas   Breakfast Ideas   Balanced Snacks   Learning Style & Readiness for Change Teaching method utilized: Visual & Auditory  Demonstrated degree of understanding via: Teach Back  Barriers to learning/adherence to lifestyle change: None Identified   Goals Established by Pt . Include snacks throughout the day as needed, and make sure they provide a source of carbohydrates and protein.    MONITORING & EVALUATION Dietary intake, weekly physical activity, and goals PRN.  Next Steps  Patient is to contact [facility] to schedule follow up visit.

## 2019-05-27 ENCOUNTER — Encounter: Payer: Self-pay | Admitting: Dietician

## 2019-05-27 DIAGNOSIS — E669 Obesity, unspecified: Secondary | ICD-10-CM | POA: Insufficient documentation

## 2019-05-27 DIAGNOSIS — Z23 Encounter for immunization: Secondary | ICD-10-CM | POA: Diagnosis not present

## 2019-06-09 DIAGNOSIS — T7840XA Allergy, unspecified, initial encounter: Secondary | ICD-10-CM | POA: Diagnosis not present

## 2019-06-26 DIAGNOSIS — Z23 Encounter for immunization: Secondary | ICD-10-CM | POA: Diagnosis not present

## 2019-09-16 DIAGNOSIS — C73 Malignant neoplasm of thyroid gland: Secondary | ICD-10-CM | POA: Diagnosis not present

## 2019-09-16 DIAGNOSIS — E039 Hypothyroidism, unspecified: Secondary | ICD-10-CM | POA: Diagnosis not present

## 2019-09-16 DIAGNOSIS — I10 Essential (primary) hypertension: Secondary | ICD-10-CM | POA: Diagnosis not present

## 2019-09-16 DIAGNOSIS — Z23 Encounter for immunization: Secondary | ICD-10-CM | POA: Diagnosis not present

## 2020-02-11 DIAGNOSIS — Z1231 Encounter for screening mammogram for malignant neoplasm of breast: Secondary | ICD-10-CM | POA: Diagnosis not present

## 2020-02-11 DIAGNOSIS — Z01419 Encounter for gynecological examination (general) (routine) without abnormal findings: Secondary | ICD-10-CM | POA: Diagnosis not present

## 2020-02-11 DIAGNOSIS — Z6836 Body mass index (BMI) 36.0-36.9, adult: Secondary | ICD-10-CM | POA: Diagnosis not present

## 2020-03-16 DIAGNOSIS — E559 Vitamin D deficiency, unspecified: Secondary | ICD-10-CM | POA: Diagnosis not present

## 2020-03-16 DIAGNOSIS — E039 Hypothyroidism, unspecified: Secondary | ICD-10-CM | POA: Diagnosis not present

## 2020-03-16 DIAGNOSIS — R739 Hyperglycemia, unspecified: Secondary | ICD-10-CM | POA: Diagnosis not present

## 2020-03-16 DIAGNOSIS — E785 Hyperlipidemia, unspecified: Secondary | ICD-10-CM | POA: Diagnosis not present

## 2020-03-23 DIAGNOSIS — R82998 Other abnormal findings in urine: Secondary | ICD-10-CM | POA: Diagnosis not present

## 2020-03-23 DIAGNOSIS — Z1212 Encounter for screening for malignant neoplasm of rectum: Secondary | ICD-10-CM | POA: Diagnosis not present

## 2020-03-23 DIAGNOSIS — Z0000 Encounter for general adult medical examination without abnormal findings: Secondary | ICD-10-CM | POA: Diagnosis not present

## 2020-03-23 DIAGNOSIS — Z1331 Encounter for screening for depression: Secondary | ICD-10-CM | POA: Diagnosis not present

## 2020-03-23 DIAGNOSIS — Z1389 Encounter for screening for other disorder: Secondary | ICD-10-CM | POA: Diagnosis not present

## 2020-03-23 DIAGNOSIS — I10 Essential (primary) hypertension: Secondary | ICD-10-CM | POA: Diagnosis not present

## 2020-04-05 DIAGNOSIS — Z1211 Encounter for screening for malignant neoplasm of colon: Secondary | ICD-10-CM | POA: Diagnosis not present

## 2020-04-05 DIAGNOSIS — Z1212 Encounter for screening for malignant neoplasm of rectum: Secondary | ICD-10-CM | POA: Diagnosis not present

## 2020-04-08 LAB — COLOGUARD: COLOGUARD: NEGATIVE

## 2020-04-08 LAB — EXTERNAL GENERIC LAB PROCEDURE: COLOGUARD: NEGATIVE

## 2020-04-09 DIAGNOSIS — I10 Essential (primary) hypertension: Secondary | ICD-10-CM | POA: Diagnosis not present

## 2020-04-09 DIAGNOSIS — J0100 Acute maxillary sinusitis, unspecified: Secondary | ICD-10-CM | POA: Diagnosis not present

## 2020-07-09 DIAGNOSIS — H1032 Unspecified acute conjunctivitis, left eye: Secondary | ICD-10-CM | POA: Diagnosis not present

## 2020-08-13 DIAGNOSIS — U071 COVID-19: Secondary | ICD-10-CM | POA: Diagnosis not present

## 2020-08-13 DIAGNOSIS — Z20822 Contact with and (suspected) exposure to covid-19: Secondary | ICD-10-CM | POA: Diagnosis not present

## 2020-09-28 DIAGNOSIS — I10 Essential (primary) hypertension: Secondary | ICD-10-CM | POA: Diagnosis not present

## 2020-09-28 DIAGNOSIS — R739 Hyperglycemia, unspecified: Secondary | ICD-10-CM | POA: Diagnosis not present

## 2020-09-28 DIAGNOSIS — E785 Hyperlipidemia, unspecified: Secondary | ICD-10-CM | POA: Diagnosis not present

## 2020-09-28 DIAGNOSIS — E039 Hypothyroidism, unspecified: Secondary | ICD-10-CM | POA: Diagnosis not present

## 2020-09-28 DIAGNOSIS — C73 Malignant neoplasm of thyroid gland: Secondary | ICD-10-CM | POA: Diagnosis not present

## 2020-09-28 DIAGNOSIS — E049 Nontoxic goiter, unspecified: Secondary | ICD-10-CM | POA: Diagnosis not present

## 2020-09-28 DIAGNOSIS — Z23 Encounter for immunization: Secondary | ICD-10-CM | POA: Diagnosis not present

## 2021-03-21 DIAGNOSIS — L821 Other seborrheic keratosis: Secondary | ICD-10-CM | POA: Diagnosis not present

## 2021-03-21 DIAGNOSIS — L720 Epidermal cyst: Secondary | ICD-10-CM | POA: Diagnosis not present

## 2021-03-21 DIAGNOSIS — D485 Neoplasm of uncertain behavior of skin: Secondary | ICD-10-CM | POA: Diagnosis not present

## 2021-03-21 DIAGNOSIS — L218 Other seborrheic dermatitis: Secondary | ICD-10-CM | POA: Diagnosis not present

## 2021-03-25 DIAGNOSIS — R739 Hyperglycemia, unspecified: Secondary | ICD-10-CM | POA: Diagnosis not present

## 2021-03-25 DIAGNOSIS — E039 Hypothyroidism, unspecified: Secondary | ICD-10-CM | POA: Diagnosis not present

## 2021-03-25 DIAGNOSIS — E785 Hyperlipidemia, unspecified: Secondary | ICD-10-CM | POA: Diagnosis not present

## 2021-03-25 DIAGNOSIS — E559 Vitamin D deficiency, unspecified: Secondary | ICD-10-CM | POA: Diagnosis not present

## 2021-04-01 DIAGNOSIS — Z1389 Encounter for screening for other disorder: Secondary | ICD-10-CM | POA: Diagnosis not present

## 2021-04-01 DIAGNOSIS — R739 Hyperglycemia, unspecified: Secondary | ICD-10-CM | POA: Diagnosis not present

## 2021-04-01 DIAGNOSIS — R82998 Other abnormal findings in urine: Secondary | ICD-10-CM | POA: Diagnosis not present

## 2021-04-01 DIAGNOSIS — Z1331 Encounter for screening for depression: Secondary | ICD-10-CM | POA: Diagnosis not present

## 2021-04-01 DIAGNOSIS — Z1212 Encounter for screening for malignant neoplasm of rectum: Secondary | ICD-10-CM | POA: Diagnosis not present

## 2021-04-01 DIAGNOSIS — Z0000 Encounter for general adult medical examination without abnormal findings: Secondary | ICD-10-CM | POA: Diagnosis not present

## 2021-04-01 DIAGNOSIS — I10 Essential (primary) hypertension: Secondary | ICD-10-CM | POA: Diagnosis not present

## 2021-04-12 DIAGNOSIS — Z01419 Encounter for gynecological examination (general) (routine) without abnormal findings: Secondary | ICD-10-CM | POA: Diagnosis not present

## 2021-04-12 DIAGNOSIS — Z6833 Body mass index (BMI) 33.0-33.9, adult: Secondary | ICD-10-CM | POA: Diagnosis not present

## 2021-04-12 DIAGNOSIS — Z1231 Encounter for screening mammogram for malignant neoplasm of breast: Secondary | ICD-10-CM | POA: Diagnosis not present

## 2021-04-21 DIAGNOSIS — B3731 Acute candidiasis of vulva and vagina: Secondary | ICD-10-CM | POA: Diagnosis not present

## 2021-04-21 DIAGNOSIS — I10 Essential (primary) hypertension: Secondary | ICD-10-CM | POA: Diagnosis not present

## 2021-04-21 DIAGNOSIS — H66002 Acute suppurative otitis media without spontaneous rupture of ear drum, left ear: Secondary | ICD-10-CM | POA: Diagnosis not present

## 2021-04-21 DIAGNOSIS — J0140 Acute pansinusitis, unspecified: Secondary | ICD-10-CM | POA: Diagnosis not present

## 2021-09-21 DIAGNOSIS — N63 Unspecified lump in unspecified breast: Secondary | ICD-10-CM | POA: Diagnosis not present

## 2021-10-21 DIAGNOSIS — E039 Hypothyroidism, unspecified: Secondary | ICD-10-CM | POA: Diagnosis not present

## 2021-10-21 DIAGNOSIS — R739 Hyperglycemia, unspecified: Secondary | ICD-10-CM | POA: Diagnosis not present

## 2021-10-21 DIAGNOSIS — I10 Essential (primary) hypertension: Secondary | ICD-10-CM | POA: Diagnosis not present

## 2021-10-21 DIAGNOSIS — C73 Malignant neoplasm of thyroid gland: Secondary | ICD-10-CM | POA: Diagnosis not present

## 2021-11-08 DIAGNOSIS — M791 Myalgia, unspecified site: Secondary | ICD-10-CM | POA: Diagnosis not present

## 2021-12-05 ENCOUNTER — Other Ambulatory Visit: Payer: Self-pay | Admitting: Internal Medicine

## 2021-12-05 ENCOUNTER — Other Ambulatory Visit: Payer: Self-pay

## 2021-12-05 DIAGNOSIS — R0789 Other chest pain: Secondary | ICD-10-CM

## 2021-12-05 DIAGNOSIS — N644 Mastodynia: Secondary | ICD-10-CM

## 2021-12-05 DIAGNOSIS — Z803 Family history of malignant neoplasm of breast: Secondary | ICD-10-CM

## 2022-01-09 ENCOUNTER — Ambulatory Visit
Admission: RE | Admit: 2022-01-09 | Discharge: 2022-01-09 | Disposition: A | Discharge status: Home / Self Care | Payer: BC Managed Care – PPO | Source: Ambulatory Visit | Attending: Internal Medicine | Admitting: Internal Medicine

## 2022-01-09 ENCOUNTER — Ambulatory Visit: Admission: RE | Admit: 2022-01-09 | Payer: Private Health Insurance - Indemnity | Source: Ambulatory Visit

## 2022-01-09 ENCOUNTER — Ambulatory Visit
Admission: RE | Admit: 2022-01-09 | Discharge: 2022-01-09 | Disposition: A | Discharge status: Home / Self Care | Payer: Private Health Insurance - Indemnity | Source: Ambulatory Visit | Attending: Internal Medicine | Admitting: Internal Medicine

## 2022-01-09 DIAGNOSIS — Z803 Family history of malignant neoplasm of breast: Secondary | ICD-10-CM

## 2022-01-09 DIAGNOSIS — N644 Mastodynia: Secondary | ICD-10-CM

## 2022-01-09 DIAGNOSIS — R0789 Other chest pain: Secondary | ICD-10-CM

## 2022-01-18 DIAGNOSIS — D235 Other benign neoplasm of skin of trunk: Secondary | ICD-10-CM | POA: Diagnosis not present

## 2022-01-18 DIAGNOSIS — L720 Epidermal cyst: Secondary | ICD-10-CM | POA: Diagnosis not present

## 2022-03-20 DIAGNOSIS — L720 Epidermal cyst: Secondary | ICD-10-CM | POA: Diagnosis not present

## 2022-03-20 DIAGNOSIS — L089 Local infection of the skin and subcutaneous tissue, unspecified: Secondary | ICD-10-CM | POA: Diagnosis not present

## 2022-03-24 DIAGNOSIS — B3731 Acute candidiasis of vulva and vagina: Secondary | ICD-10-CM | POA: Diagnosis not present

## 2022-03-24 DIAGNOSIS — E039 Hypothyroidism, unspecified: Secondary | ICD-10-CM | POA: Diagnosis not present

## 2022-03-24 DIAGNOSIS — J3489 Other specified disorders of nose and nasal sinuses: Secondary | ICD-10-CM | POA: Diagnosis not present

## 2022-03-24 DIAGNOSIS — Z6835 Body mass index (BMI) 35.0-35.9, adult: Secondary | ICD-10-CM | POA: Diagnosis not present

## 2022-03-24 DIAGNOSIS — Z789 Other specified health status: Secondary | ICD-10-CM | POA: Diagnosis not present

## 2022-03-24 DIAGNOSIS — I10 Essential (primary) hypertension: Secondary | ICD-10-CM | POA: Diagnosis not present

## 2022-04-07 DIAGNOSIS — R7989 Other specified abnormal findings of blood chemistry: Secondary | ICD-10-CM | POA: Diagnosis not present

## 2022-04-07 DIAGNOSIS — E785 Hyperlipidemia, unspecified: Secondary | ICD-10-CM | POA: Diagnosis not present

## 2022-04-07 DIAGNOSIS — E559 Vitamin D deficiency, unspecified: Secondary | ICD-10-CM | POA: Diagnosis not present

## 2022-04-07 DIAGNOSIS — I10 Essential (primary) hypertension: Secondary | ICD-10-CM | POA: Diagnosis not present

## 2022-04-07 DIAGNOSIS — E039 Hypothyroidism, unspecified: Secondary | ICD-10-CM | POA: Diagnosis not present

## 2022-04-07 DIAGNOSIS — R739 Hyperglycemia, unspecified: Secondary | ICD-10-CM | POA: Diagnosis not present

## 2022-04-11 DIAGNOSIS — Z1212 Encounter for screening for malignant neoplasm of rectum: Secondary | ICD-10-CM | POA: Diagnosis not present

## 2022-04-14 DIAGNOSIS — Z1339 Encounter for screening examination for other mental health and behavioral disorders: Secondary | ICD-10-CM | POA: Diagnosis not present

## 2022-04-14 DIAGNOSIS — Z1331 Encounter for screening for depression: Secondary | ICD-10-CM | POA: Diagnosis not present

## 2022-04-14 DIAGNOSIS — E119 Type 2 diabetes mellitus without complications: Secondary | ICD-10-CM | POA: Diagnosis not present

## 2022-04-14 DIAGNOSIS — I10 Essential (primary) hypertension: Secondary | ICD-10-CM | POA: Diagnosis not present

## 2022-04-14 DIAGNOSIS — C73 Malignant neoplasm of thyroid gland: Secondary | ICD-10-CM | POA: Diagnosis not present

## 2022-04-14 DIAGNOSIS — Z0000 Encounter for general adult medical examination without abnormal findings: Secondary | ICD-10-CM | POA: Diagnosis not present

## 2022-04-14 DIAGNOSIS — E785 Hyperlipidemia, unspecified: Secondary | ICD-10-CM | POA: Diagnosis not present

## 2022-04-14 DIAGNOSIS — R82998 Other abnormal findings in urine: Secondary | ICD-10-CM | POA: Diagnosis not present

## 2022-07-28 DIAGNOSIS — R7303 Prediabetes: Secondary | ICD-10-CM | POA: Diagnosis not present

## 2022-08-03 DIAGNOSIS — M545 Low back pain, unspecified: Secondary | ICD-10-CM | POA: Diagnosis not present

## 2022-08-24 DIAGNOSIS — E119 Type 2 diabetes mellitus without complications: Secondary | ICD-10-CM | POA: Diagnosis not present

## 2022-08-31 DIAGNOSIS — M5126 Other intervertebral disc displacement, lumbar region: Secondary | ICD-10-CM | POA: Diagnosis not present

## 2022-09-11 DIAGNOSIS — S335XXD Sprain of ligaments of lumbar spine, subsequent encounter: Secondary | ICD-10-CM | POA: Diagnosis not present

## 2022-09-11 DIAGNOSIS — Z7409 Other reduced mobility: Secondary | ICD-10-CM | POA: Diagnosis not present

## 2022-09-11 DIAGNOSIS — M5442 Lumbago with sciatica, left side: Secondary | ICD-10-CM | POA: Diagnosis not present

## 2022-09-11 DIAGNOSIS — G8929 Other chronic pain: Secondary | ICD-10-CM | POA: Diagnosis not present

## 2022-09-19 DIAGNOSIS — S39012D Strain of muscle, fascia and tendon of lower back, subsequent encounter: Secondary | ICD-10-CM | POA: Diagnosis not present

## 2022-09-19 DIAGNOSIS — S335XXD Sprain of ligaments of lumbar spine, subsequent encounter: Secondary | ICD-10-CM | POA: Diagnosis not present

## 2022-09-22 DIAGNOSIS — S39012D Strain of muscle, fascia and tendon of lower back, subsequent encounter: Secondary | ICD-10-CM | POA: Diagnosis not present

## 2022-09-22 DIAGNOSIS — S335XXD Sprain of ligaments of lumbar spine, subsequent encounter: Secondary | ICD-10-CM | POA: Diagnosis not present

## 2022-09-25 DIAGNOSIS — S335XXD Sprain of ligaments of lumbar spine, subsequent encounter: Secondary | ICD-10-CM | POA: Diagnosis not present

## 2022-09-25 DIAGNOSIS — S39012D Strain of muscle, fascia and tendon of lower back, subsequent encounter: Secondary | ICD-10-CM | POA: Diagnosis not present

## 2022-09-27 DIAGNOSIS — M5442 Lumbago with sciatica, left side: Secondary | ICD-10-CM | POA: Diagnosis not present

## 2022-09-27 DIAGNOSIS — S39012S Strain of muscle, fascia and tendon of lower back, sequela: Secondary | ICD-10-CM | POA: Diagnosis not present

## 2022-09-27 DIAGNOSIS — Z7409 Other reduced mobility: Secondary | ICD-10-CM | POA: Diagnosis not present

## 2022-09-27 DIAGNOSIS — S335XXS Sprain of ligaments of lumbar spine, sequela: Secondary | ICD-10-CM | POA: Diagnosis not present

## 2022-10-03 DIAGNOSIS — S39012S Strain of muscle, fascia and tendon of lower back, sequela: Secondary | ICD-10-CM | POA: Diagnosis not present

## 2022-10-03 DIAGNOSIS — Z7409 Other reduced mobility: Secondary | ICD-10-CM | POA: Diagnosis not present

## 2022-10-03 DIAGNOSIS — S335XXS Sprain of ligaments of lumbar spine, sequela: Secondary | ICD-10-CM | POA: Diagnosis not present

## 2022-10-03 DIAGNOSIS — M5442 Lumbago with sciatica, left side: Secondary | ICD-10-CM | POA: Diagnosis not present

## 2022-10-05 DIAGNOSIS — S39012D Strain of muscle, fascia and tendon of lower back, subsequent encounter: Secondary | ICD-10-CM | POA: Diagnosis not present

## 2022-10-05 DIAGNOSIS — S335XXD Sprain of ligaments of lumbar spine, subsequent encounter: Secondary | ICD-10-CM | POA: Diagnosis not present

## 2022-10-05 DIAGNOSIS — M5442 Lumbago with sciatica, left side: Secondary | ICD-10-CM | POA: Diagnosis not present

## 2022-10-05 DIAGNOSIS — M2560 Stiffness of unspecified joint, not elsewhere classified: Secondary | ICD-10-CM | POA: Diagnosis not present

## 2022-10-10 DIAGNOSIS — S335XXD Sprain of ligaments of lumbar spine, subsequent encounter: Secondary | ICD-10-CM | POA: Diagnosis not present

## 2022-10-10 DIAGNOSIS — G8929 Other chronic pain: Secondary | ICD-10-CM | POA: Diagnosis not present

## 2022-10-10 DIAGNOSIS — M5442 Lumbago with sciatica, left side: Secondary | ICD-10-CM | POA: Diagnosis not present

## 2022-10-10 DIAGNOSIS — S39012D Strain of muscle, fascia and tendon of lower back, subsequent encounter: Secondary | ICD-10-CM | POA: Diagnosis not present

## 2022-10-12 DIAGNOSIS — S335XXD Sprain of ligaments of lumbar spine, subsequent encounter: Secondary | ICD-10-CM | POA: Diagnosis not present

## 2022-10-12 DIAGNOSIS — M5442 Lumbago with sciatica, left side: Secondary | ICD-10-CM | POA: Diagnosis not present

## 2022-10-12 DIAGNOSIS — G8929 Other chronic pain: Secondary | ICD-10-CM | POA: Diagnosis not present

## 2022-10-12 DIAGNOSIS — Z7409 Other reduced mobility: Secondary | ICD-10-CM | POA: Diagnosis not present

## 2022-10-17 DIAGNOSIS — S39012D Strain of muscle, fascia and tendon of lower back, subsequent encounter: Secondary | ICD-10-CM | POA: Diagnosis not present

## 2022-10-17 DIAGNOSIS — S335XXD Sprain of ligaments of lumbar spine, subsequent encounter: Secondary | ICD-10-CM | POA: Diagnosis not present

## 2022-10-17 DIAGNOSIS — M5442 Lumbago with sciatica, left side: Secondary | ICD-10-CM | POA: Diagnosis not present

## 2022-10-17 DIAGNOSIS — Z7409 Other reduced mobility: Secondary | ICD-10-CM | POA: Diagnosis not present

## 2022-10-19 DIAGNOSIS — S335XXS Sprain of ligaments of lumbar spine, sequela: Secondary | ICD-10-CM | POA: Diagnosis not present

## 2022-10-19 DIAGNOSIS — M5442 Lumbago with sciatica, left side: Secondary | ICD-10-CM | POA: Diagnosis not present

## 2022-10-19 DIAGNOSIS — Z7409 Other reduced mobility: Secondary | ICD-10-CM | POA: Diagnosis not present

## 2022-10-19 DIAGNOSIS — S39012S Strain of muscle, fascia and tendon of lower back, sequela: Secondary | ICD-10-CM | POA: Diagnosis not present

## 2022-10-31 DIAGNOSIS — S39012D Strain of muscle, fascia and tendon of lower back, subsequent encounter: Secondary | ICD-10-CM | POA: Diagnosis not present

## 2022-10-31 DIAGNOSIS — S335XXD Sprain of ligaments of lumbar spine, subsequent encounter: Secondary | ICD-10-CM | POA: Diagnosis not present

## 2022-11-02 DIAGNOSIS — S335XXS Sprain of ligaments of lumbar spine, sequela: Secondary | ICD-10-CM | POA: Diagnosis not present

## 2022-11-02 DIAGNOSIS — Z7409 Other reduced mobility: Secondary | ICD-10-CM | POA: Diagnosis not present

## 2022-11-02 DIAGNOSIS — M5442 Lumbago with sciatica, left side: Secondary | ICD-10-CM | POA: Diagnosis not present

## 2022-11-02 DIAGNOSIS — S39012S Strain of muscle, fascia and tendon of lower back, sequela: Secondary | ICD-10-CM | POA: Diagnosis not present

## 2022-11-04 DIAGNOSIS — M5459 Other low back pain: Secondary | ICD-10-CM | POA: Diagnosis not present

## 2022-11-07 DIAGNOSIS — M5442 Lumbago with sciatica, left side: Secondary | ICD-10-CM | POA: Diagnosis not present

## 2022-11-07 DIAGNOSIS — S39012S Strain of muscle, fascia and tendon of lower back, sequela: Secondary | ICD-10-CM | POA: Diagnosis not present

## 2022-11-07 DIAGNOSIS — Z7409 Other reduced mobility: Secondary | ICD-10-CM | POA: Diagnosis not present

## 2022-11-07 DIAGNOSIS — S335XXS Sprain of ligaments of lumbar spine, sequela: Secondary | ICD-10-CM | POA: Diagnosis not present

## 2022-11-08 DIAGNOSIS — M5126 Other intervertebral disc displacement, lumbar region: Secondary | ICD-10-CM | POA: Diagnosis not present

## 2022-11-08 DIAGNOSIS — M5459 Other low back pain: Secondary | ICD-10-CM | POA: Diagnosis not present

## 2022-11-09 DIAGNOSIS — S39012S Strain of muscle, fascia and tendon of lower back, sequela: Secondary | ICD-10-CM | POA: Diagnosis not present

## 2022-11-09 DIAGNOSIS — Z7409 Other reduced mobility: Secondary | ICD-10-CM | POA: Diagnosis not present

## 2022-11-09 DIAGNOSIS — S335XXS Sprain of ligaments of lumbar spine, sequela: Secondary | ICD-10-CM | POA: Diagnosis not present

## 2022-11-09 DIAGNOSIS — M5442 Lumbago with sciatica, left side: Secondary | ICD-10-CM | POA: Diagnosis not present

## 2022-11-14 DIAGNOSIS — Z7409 Other reduced mobility: Secondary | ICD-10-CM | POA: Diagnosis not present

## 2022-11-14 DIAGNOSIS — S335XXS Sprain of ligaments of lumbar spine, sequela: Secondary | ICD-10-CM | POA: Diagnosis not present

## 2022-11-14 DIAGNOSIS — S39012S Strain of muscle, fascia and tendon of lower back, sequela: Secondary | ICD-10-CM | POA: Diagnosis not present

## 2022-11-14 DIAGNOSIS — M5442 Lumbago with sciatica, left side: Secondary | ICD-10-CM | POA: Diagnosis not present

## 2022-11-16 DIAGNOSIS — Z7409 Other reduced mobility: Secondary | ICD-10-CM | POA: Diagnosis not present

## 2022-11-16 DIAGNOSIS — M5442 Lumbago with sciatica, left side: Secondary | ICD-10-CM | POA: Diagnosis not present

## 2022-11-16 DIAGNOSIS — S39012S Strain of muscle, fascia and tendon of lower back, sequela: Secondary | ICD-10-CM | POA: Diagnosis not present

## 2022-11-16 DIAGNOSIS — S335XXS Sprain of ligaments of lumbar spine, sequela: Secondary | ICD-10-CM | POA: Diagnosis not present

## 2022-11-21 DIAGNOSIS — S335XXS Sprain of ligaments of lumbar spine, sequela: Secondary | ICD-10-CM | POA: Diagnosis not present

## 2022-11-21 DIAGNOSIS — S39012S Strain of muscle, fascia and tendon of lower back, sequela: Secondary | ICD-10-CM | POA: Diagnosis not present

## 2022-11-21 DIAGNOSIS — Z7409 Other reduced mobility: Secondary | ICD-10-CM | POA: Diagnosis not present

## 2022-11-21 DIAGNOSIS — M5442 Lumbago with sciatica, left side: Secondary | ICD-10-CM | POA: Diagnosis not present

## 2022-11-23 DIAGNOSIS — M5442 Lumbago with sciatica, left side: Secondary | ICD-10-CM | POA: Diagnosis not present

## 2022-11-23 DIAGNOSIS — S39012S Strain of muscle, fascia and tendon of lower back, sequela: Secondary | ICD-10-CM | POA: Diagnosis not present

## 2022-11-23 DIAGNOSIS — Z7409 Other reduced mobility: Secondary | ICD-10-CM | POA: Diagnosis not present

## 2022-11-23 DIAGNOSIS — M5416 Radiculopathy, lumbar region: Secondary | ICD-10-CM | POA: Diagnosis not present

## 2022-11-23 DIAGNOSIS — S335XXS Sprain of ligaments of lumbar spine, sequela: Secondary | ICD-10-CM | POA: Diagnosis not present

## 2022-11-27 DIAGNOSIS — S335XXS Sprain of ligaments of lumbar spine, sequela: Secondary | ICD-10-CM | POA: Diagnosis not present

## 2022-11-27 DIAGNOSIS — M5442 Lumbago with sciatica, left side: Secondary | ICD-10-CM | POA: Diagnosis not present

## 2022-11-27 DIAGNOSIS — S39012S Strain of muscle, fascia and tendon of lower back, sequela: Secondary | ICD-10-CM | POA: Diagnosis not present

## 2022-11-27 DIAGNOSIS — Z7409 Other reduced mobility: Secondary | ICD-10-CM | POA: Diagnosis not present

## 2022-11-29 DIAGNOSIS — M5442 Lumbago with sciatica, left side: Secondary | ICD-10-CM | POA: Diagnosis not present

## 2022-11-29 DIAGNOSIS — S39012S Strain of muscle, fascia and tendon of lower back, sequela: Secondary | ICD-10-CM | POA: Diagnosis not present

## 2022-11-29 DIAGNOSIS — S335XXS Sprain of ligaments of lumbar spine, sequela: Secondary | ICD-10-CM | POA: Diagnosis not present

## 2022-11-29 DIAGNOSIS — Z7409 Other reduced mobility: Secondary | ICD-10-CM | POA: Diagnosis not present

## 2022-12-08 DIAGNOSIS — S39012S Strain of muscle, fascia and tendon of lower back, sequela: Secondary | ICD-10-CM | POA: Diagnosis not present

## 2022-12-08 DIAGNOSIS — M5442 Lumbago with sciatica, left side: Secondary | ICD-10-CM | POA: Diagnosis not present

## 2022-12-08 DIAGNOSIS — S335XXS Sprain of ligaments of lumbar spine, sequela: Secondary | ICD-10-CM | POA: Diagnosis not present

## 2022-12-08 DIAGNOSIS — Z7409 Other reduced mobility: Secondary | ICD-10-CM | POA: Diagnosis not present

## 2022-12-12 DIAGNOSIS — M5416 Radiculopathy, lumbar region: Secondary | ICD-10-CM | POA: Diagnosis not present

## 2022-12-15 DIAGNOSIS — M5442 Lumbago with sciatica, left side: Secondary | ICD-10-CM | POA: Diagnosis not present

## 2022-12-15 DIAGNOSIS — S39012S Strain of muscle, fascia and tendon of lower back, sequela: Secondary | ICD-10-CM | POA: Diagnosis not present

## 2022-12-15 DIAGNOSIS — Z7409 Other reduced mobility: Secondary | ICD-10-CM | POA: Diagnosis not present

## 2022-12-15 DIAGNOSIS — S335XXS Sprain of ligaments of lumbar spine, sequela: Secondary | ICD-10-CM | POA: Diagnosis not present

## 2022-12-18 DIAGNOSIS — S335XXS Sprain of ligaments of lumbar spine, sequela: Secondary | ICD-10-CM | POA: Diagnosis not present

## 2022-12-18 DIAGNOSIS — S39012S Strain of muscle, fascia and tendon of lower back, sequela: Secondary | ICD-10-CM | POA: Diagnosis not present

## 2022-12-18 DIAGNOSIS — Z7409 Other reduced mobility: Secondary | ICD-10-CM | POA: Diagnosis not present

## 2022-12-18 DIAGNOSIS — M5442 Lumbago with sciatica, left side: Secondary | ICD-10-CM | POA: Diagnosis not present

## 2022-12-20 DIAGNOSIS — E119 Type 2 diabetes mellitus without complications: Secondary | ICD-10-CM | POA: Diagnosis not present

## 2022-12-20 DIAGNOSIS — Z1331 Encounter for screening for depression: Secondary | ICD-10-CM | POA: Diagnosis not present

## 2022-12-20 DIAGNOSIS — Z01419 Encounter for gynecological examination (general) (routine) without abnormal findings: Secondary | ICD-10-CM | POA: Diagnosis not present

## 2022-12-21 DIAGNOSIS — Z7409 Other reduced mobility: Secondary | ICD-10-CM | POA: Diagnosis not present

## 2022-12-21 DIAGNOSIS — M5442 Lumbago with sciatica, left side: Secondary | ICD-10-CM | POA: Diagnosis not present

## 2022-12-21 DIAGNOSIS — S335XXS Sprain of ligaments of lumbar spine, sequela: Secondary | ICD-10-CM | POA: Diagnosis not present

## 2022-12-21 DIAGNOSIS — S39012S Strain of muscle, fascia and tendon of lower back, sequela: Secondary | ICD-10-CM | POA: Diagnosis not present

## 2022-12-28 DIAGNOSIS — S39012S Strain of muscle, fascia and tendon of lower back, sequela: Secondary | ICD-10-CM | POA: Diagnosis not present

## 2022-12-28 DIAGNOSIS — M5442 Lumbago with sciatica, left side: Secondary | ICD-10-CM | POA: Diagnosis not present

## 2022-12-28 DIAGNOSIS — Z7409 Other reduced mobility: Secondary | ICD-10-CM | POA: Diagnosis not present

## 2022-12-28 DIAGNOSIS — S335XXS Sprain of ligaments of lumbar spine, sequela: Secondary | ICD-10-CM | POA: Diagnosis not present

## 2023-01-01 DIAGNOSIS — S335XXS Sprain of ligaments of lumbar spine, sequela: Secondary | ICD-10-CM | POA: Diagnosis not present

## 2023-01-01 DIAGNOSIS — S39012S Strain of muscle, fascia and tendon of lower back, sequela: Secondary | ICD-10-CM | POA: Diagnosis not present

## 2023-01-01 DIAGNOSIS — Z7409 Other reduced mobility: Secondary | ICD-10-CM | POA: Diagnosis not present

## 2023-01-01 DIAGNOSIS — M5442 Lumbago with sciatica, left side: Secondary | ICD-10-CM | POA: Diagnosis not present

## 2023-01-04 DIAGNOSIS — Z7409 Other reduced mobility: Secondary | ICD-10-CM | POA: Diagnosis not present

## 2023-01-04 DIAGNOSIS — S335XXS Sprain of ligaments of lumbar spine, sequela: Secondary | ICD-10-CM | POA: Diagnosis not present

## 2023-01-04 DIAGNOSIS — M5442 Lumbago with sciatica, left side: Secondary | ICD-10-CM | POA: Diagnosis not present

## 2023-01-04 DIAGNOSIS — S39012S Strain of muscle, fascia and tendon of lower back, sequela: Secondary | ICD-10-CM | POA: Diagnosis not present

## 2023-02-24 ENCOUNTER — Emergency Department (HOSPITAL_BASED_OUTPATIENT_CLINIC_OR_DEPARTMENT_OTHER): Payer: BC Managed Care – PPO

## 2023-02-24 ENCOUNTER — Emergency Department (HOSPITAL_BASED_OUTPATIENT_CLINIC_OR_DEPARTMENT_OTHER)
Admission: EM | Admit: 2023-02-24 | Discharge: 2023-02-24 | Disposition: A | Discharge status: Home / Self Care | Payer: BC Managed Care – PPO | Attending: Emergency Medicine | Admitting: Emergency Medicine

## 2023-02-24 DIAGNOSIS — M5416 Radiculopathy, lumbar region: Secondary | ICD-10-CM | POA: Insufficient documentation

## 2023-02-24 DIAGNOSIS — Z859 Personal history of malignant neoplasm, unspecified: Secondary | ICD-10-CM | POA: Insufficient documentation

## 2023-02-24 DIAGNOSIS — E119 Type 2 diabetes mellitus without complications: Secondary | ICD-10-CM | POA: Diagnosis not present

## 2023-02-24 DIAGNOSIS — Z7984 Long term (current) use of oral hypoglycemic drugs: Secondary | ICD-10-CM | POA: Diagnosis not present

## 2023-02-24 DIAGNOSIS — J101 Influenza due to other identified influenza virus with other respiratory manifestations: Secondary | ICD-10-CM | POA: Diagnosis not present

## 2023-02-24 DIAGNOSIS — R059 Cough, unspecified: Secondary | ICD-10-CM | POA: Diagnosis not present

## 2023-02-24 DIAGNOSIS — I10 Essential (primary) hypertension: Secondary | ICD-10-CM | POA: Insufficient documentation

## 2023-02-24 DIAGNOSIS — M5410 Radiculopathy, site unspecified: Secondary | ICD-10-CM

## 2023-02-24 LAB — RESP PANEL BY RT-PCR (RSV, FLU A&B, COVID)  RVPGX2
Influenza A by PCR: POSITIVE — AB
Influenza B by PCR: NEGATIVE
Resp Syncytial Virus by PCR: NEGATIVE
SARS Coronavirus 2 by RT PCR: NEGATIVE

## 2023-02-24 MED ORDER — BENZONATATE 100 MG PO CAPS: 100.0000 mg | ORAL_CAPSULE | Freq: Three times a day (TID) | ORAL | 0 refills | Status: DC

## 2023-02-24 MED ORDER — CYCLOBENZAPRINE HCL 10 MG PO TABS: 10.0000 mg | ORAL_TABLET | Freq: Two times a day (BID) | ORAL | 0 refills | Status: DC | PRN

## 2023-02-24 MED ORDER — GABAPENTIN 100 MG PO CAPS: 100.0000 mg | ORAL_CAPSULE | Freq: Three times a day (TID) | ORAL | 0 refills | Status: AC

## 2023-02-24 NOTE — ED Triage Notes (Addendum)
 Pt c/o cough and congestion since Wednesday, denies fever or body aches at home. Reports pain in her back with coughing.  Has tried OTC medications with minimal relief.  Last dose of mucinex cold & cough 3am today.  Pt also reports chronic back & hip pain that has been worsened by coughing fits. Pt states some SOB after a coughing spell.

## 2023-02-24 NOTE — ED Provider Notes (Signed)
  [place] [facility] Provider Note   CSN: [id number] Arrival date & time: 02/24/23  6578     History {Add pertinent medical, surgical, social history, OB history to HPI:1} Chief Complaint  Patient presents with   Cough    Raven Wagner is a 53 y.o. female.  HPI     53yo female hypertension, hyperlipidemia, DM, past history of ovarian (TH 1991), and thyroid (thyroidectomy 2007) who presents with concern for cough and congestion.   Coughing and congestion since Wednesday Has hx of back pain since July, was doing physical therapy and was getting better, released from PT,  then started getting worse again with this illness coughing.  Pain hip and tailbone, with cough it radiates down the left leg.  No fever, no body aches No nausea, vomiting or diarrhea No numbness, weakness, loss control of bowel or bladder, no hx of IVDU or smoking  Took tylenol yesterday temp 99 Took volteran pill  Mucinex nasal spray, alka seltzer, mucinex   Past Medical History:  Diagnosis Date   Cancer (HCC)    Hyperlipidemia    Hypertension      Home Medications Prior to Admission medications   Not on File      Allergies    Promethazine, Bismuth subsalicylate, and Simvastatin    Review of Systems   Review of Systems  Physical Exam Updated Vital Signs BP (!) 141/89 (BP Location: Right Arm)   Pulse 96   Temp 98.5 F (36.9 C) (Oral)   Resp 18   SpO2 98%  Physical Exam  ED Results / Procedures / Treatments   Labs (all labs ordered are listed, but only abnormal results are displayed) Labs Reviewed  RESP PANEL BY RT-PCR (RSV, FLU A&B, COVID)  RVPGX2 - Abnormal; Notable for the following components:      Result Value   Influenza A by PCR POSITIVE (*)    All other components within normal limits    EKG None  Radiology DG Chest 2 View Result Date: 02/24/2023 CLINICAL DATA:  Cough. EXAM: CHEST - 2 VIEW COMPARISON:  February 19, 2007. FINDINGS:  The heart size and mediastinal contours are within normal limits. Both lungs are clear. The visualized skeletal structures are unremarkable. IMPRESSION: No active cardiopulmonary disease. Electronically Signed   By: Lupita Raider M.D.   On: 02/24/2023 09:15    Procedures Procedures  {Document cardiac monitor, telemetry assessment procedure when appropriate:1}  Medications Ordered in ED Medications - No data to display  ED Course/ Medical Decision Making/ A&P   {   Click here for ABCD2, HEART and other calculatorsREFRESH Note before signing :1}                              Medical Decision Making Amount and/or Complexity of Data Reviewed Radiology: ordered.   ***  {Document critical care time when appropriate:1} {Document review of labs and clinical decision tools ie heart score, Chads2Vasc2 etc:1}  {Document your independent review of radiology images, and any outside records:1} {Document your discussion with family members, caretakers, and with consultants:1} {Document social determinants of health affecting pt's care:1} {Document your decision making why or why not admission, treatments were needed:1} Final Clinical Impression(s) / ED Diagnoses Final diagnoses:  None    Rx / DC Orders ED Discharge Orders     None

## 2023-03-07 ENCOUNTER — Encounter: Payer: Self-pay | Admitting: Physical Therapy

## 2023-03-07 ENCOUNTER — Ambulatory Visit: Payer: Commercial Indemnity | Attending: Obstetrics and Gynecology | Admitting: Physical Therapy

## 2023-03-07 ENCOUNTER — Other Ambulatory Visit: Payer: Self-pay

## 2023-03-07 DIAGNOSIS — R279 Unspecified lack of coordination: Secondary | ICD-10-CM | POA: Insufficient documentation

## 2023-03-07 DIAGNOSIS — R293 Abnormal posture: Secondary | ICD-10-CM | POA: Insufficient documentation

## 2023-03-07 DIAGNOSIS — M6281 Muscle weakness (generalized): Secondary | ICD-10-CM | POA: Insufficient documentation

## 2023-03-07 NOTE — Therapy (Signed)
 OUTPATIENT PHYSICAL THERAPY FEMALE PELVIC EVALUATION   Patient Name: Raven Wagner MRN: [id number] DOB:Sep 25, 1970, 53 y.o., female Today's Date: 03/07/2023  END OF SESSION:  PT End of Session - 03/07/23 1520     Visit Number 1    Number of Visits 8    Date for PT Re-Evaluation 04/04/23    Authorization Type BCBS    Progress Note Due on Visit 20    PT Start Time 0239    PT Stop Time 0325    PT Time Calculation (min) 46 min    Activity Tolerance Patient tolerated treatment well    Behavior During Therapy WFL for tasks assessed/performed             Past Medical History:  Diagnosis Date   Cancer (HCC)    Hyperlipidemia    Hypertension    Past Surgical History:  Procedure Laterality Date   ABDOMINAL HYSTERECTOMY     SALPINGOOPHORECTOMY     THYROIDECTOMY     Patient Active Problem List   Diagnosis Date Noted   Obesity 05/27/2019    PCP: Creola Corn, MD   REFERRING PROVIDER: Olivia Mackie, MD   REFERRING DIAG: N39.3 (ICD-10-CM) - Stress incontinence  THERAPY DIAG:  Muscle weakness (generalized)  Unspecified lack of coordination  Abnormal posture  Rationale for Evaluation and Treatment: Rehabilitation  ONSET DATE: 2021  SUBJECTIVE:                                                                                                                                                                                           SUBJECTIVE STATEMENT: Patient used to only leak with coughing when sick, but now she is noticing that leakage is happening with forward bending, coughing, and sneezing. No urge incontinence to report. Fluid intake: carries an 18 oz jug, tries to drink 2-4 of these a day; sweet tea or ginger ale or coke zero   PAIN:  Are you having pain? No NPRS scale: 0/10  PRECAUTIONS: None  RED FLAGS: None   WEIGHT BEARING RESTRICTIONS: No  FALLS:  Has patient fallen in last 6 months? No  OCCUPATION: sits all day at [employer] [place], has a standing  desk available   ACTIVITY LEVEL: tries to walk, hasn't been into exercise recently due to bulging disc which has gotten much better   PLOF: Independent  PATIENT GOALS: decrease leakage, strengthen the pelvis and core   PERTINENT HISTORY:  Hx: cancer, hyperlipidemia, hypertension, abdominal hysterectomy, salpingoophorectomy, thyroidectomy  Sexual abuse: No  BOWEL MOVEMENT: Pain with bowel movement: No Type of bowel movement:Type (Bristol Stool Scale) 3-4, Frequency every other day, Strain yes -  has hemorrhoids on and off, and Splinting no Fully empty rectum: Yes:   Leakage: No Pads: No Fiber supplement/laxative Yes - takes a fiber gummy daily to help with constipation due to taking ozempic   URINATION: Pain with urination: No Fully empty bladder: Yes: feels like she could keep peeing even though she is done  Stream: Weak Urgency: Yes  Frequency: 4x/day, only gets up to void at night if she drinks water late  Leakage: Coughing, Sneezing, and Bending forward Pads: Yes: 1x/day  INTERCOURSE: not currently sexually active   Ability to have vaginal penetration No  Pain with intercourse: none DrynessNo Climax: yes Marinoff Scale: 0/3  PREGNANCY: No children   PROLAPSE: None   OBJECTIVE:  Note: Objective measures were completed at Evaluation unless otherwise noted.  PATIENT SURVEYS:  PFIQ-7: 0  COGNITION: Overall cognitive status: Within functional limits for tasks assessed     SENSATION: Light touch: Appears intact  LUMBAR SPECIAL TESTS:  Single leg stance test: Positive  FUNCTIONAL TESTS:  Squat: bilateral dynamic knee valgus, no pain, no leakage   GAIT: Comments: mild trendelenburg gait pattern with ambulation   POSTURE: rounded shoulders, forward head, increased lumbar lordosis, and increased thoracic kyphosis   LUMBARAROM/PROM: within functional limits   A/PROM A/PROM  eval  Flexion   Extension   Right lateral flexion   Left lateral flexion    Right rotation   Left rotation    (Blank rows = not tested)  LOWER EXTREMITY ROM: within normal limits   Active ROM Right eval Left eval  Hip flexion    Hip extension    Hip abduction    Hip adduction    Hip internal rotation    Hip external rotation    Knee flexion    Knee extension    Ankle dorsiflexion    Ankle plantarflexion    Ankle inversion    Ankle eversion     (Blank rows = not tested)  LOWER EXTREMITY MMT: 4/5 bilateral knees and hips grossly   MMT Right eval Left eval  Hip flexion    Hip extension    Hip abduction    Hip adduction    Hip internal rotation    Hip external rotation    Knee flexion    Knee extension    Ankle dorsiflexion    Ankle plantarflexion    Ankle inversion    Ankle eversion     (Blank rows = not tested) PALPATION:   General: increased muscle tension bilateral adductors/hip flexors   Pelvic Alignment: within normal limits   Abdominal: decreased rib excursion with inhalation, abdominal bracing at rest, upper chest breathing at baseline                External Perineal Exam: minimal dryness noted,                              Internal Pelvic Floor: weakness noted throughout with no pain during examination. Patient's pelvic floor AROM is limited due to stiffness/weakness. With internal cueing, patient was able to actively lengthen her pelvic floor with inhalation. No pain following examination.   Patient confirms identification and approves PT to assess internal pelvic floor and treatment Yes  PELVIC MMT:   MMT eval  Vaginal   Internal Anal Sphincter   External Anal Sphincter   Puborectalis   Diastasis Recti   (Blank rows = not tested)        TONE:  Within normal limits   PROLAPSE: None   TODAY'S TREATMENT:                                                                                                                              DATE:   EVAL 03/07/23: Examination completed, findings reviewed, pt educated on POC, HEP,  and self care. Pt motivated to participate in PT and agreeable to attempt recommendations.   Neuro re-ed: Hooklying diaphragmatic breathing + pelvic floor lengthening/shortening with inhalation/exhalation 3x10 breaths  Self care:  Relative anatomy, connection between the pelvic floor and diaphragm, pelvic floor active range of motion, intraabdominal pressure management and the pelvic floor connection   PATIENT EDUCATION:  Education details: Relative anatomy, connection between the pelvic floor and diaphragm, pelvic floor active range of motion, intraabdominal pressure management and the pelvic floor connection  Person educated: Patient Education method: Explanation, Demonstration, Tactile cues, Verbal cues, and Handouts Education comprehension: verbalized understanding, returned demonstration, verbal cues required, tactile cues required, and needs further education  HOME EXERCISE PROGRAM: Access Code: [email or web address] URL: [email or web address] Date: 03/07/2023 Prepared by: Earna Coder  Exercises - Supine Pelvic Floor Contraction  - 1 x daily - 7 x weekly - 3 sets - 10 reps  ASSESSMENT:  CLINICAL IMPRESSION: Patient is a 53 y.o. female  who was seen today for physical therapy evaluation and treatment for stress incontinence. Patient has been experiencing urinary incontinence since 2021 when she had COVID. Since then, she leaks with coughing/sneezing/laughing/forceful movement. Patient fully consents to today's internal examination which revealed weakness noted throughout with no pain during examination. Patient's pelvic floor AROM is limited due to stiffness/weakness. With internal cueing, patient was able to actively lengthen her pelvic floor with inhalation. No pain following examination. Pt would benefit from additional PT to further address deficits.   OBJECTIVE IMPAIRMENTS: decreased coordination, decreased endurance, decreased ROM, decreased strength, and hypomobility.    ACTIVITY LIMITATIONS: carrying, bending, squatting, and continence  PARTICIPATION LIMITATIONS:  none  PERSONAL FACTORS: Past/current experiences and Time since onset of injury/illness/exacerbation are also affecting patient's functional outcome.   REHAB POTENTIAL: Good  CLINICAL DECISION MAKING: Stable/uncomplicated  EVALUATION COMPLEXITY: Low   GOALS: Goals reviewed with patient? Yes  SHORT TERM GOALS: Target date: 04/04/2023  Pt will be independent with HEP.  Baseline: Goal status: INITIAL  2.  Pt will be independent with diaphragmatic breathing and down training activities in order to improve pelvic floor relaxation. Baseline:  Goal status: INITIAL  3.  Pt will be independent with the knack, urge suppression technique, and double voiding in order to improve bladder habits and decrease urinary incontinence.   Baseline:  Goal status: INITIAL  LONG TERM GOALS: Target date: 09/07/2023  Pt will be independent with advanced HEP.  Baseline:  Goal status: INITIAL  2.  Pt to demonstrate improved coordination of pelvic floor and breathing mechanics with 10# squat with appropriate synergistic patterns to decrease pain and leakage at least 75% of the  time.    Baseline:  Goal status: INITIAL  3.  Pt will be able to functional actions such as forward bending/lifting without leakage to improve quality of life. Baseline:  Goal status: INITIAL  4.  Pt will have 75% reduced leakage during a typical day to improve quality of life.  Baseline:  Goal status: INITIAL  PLAN:  PT FREQUENCY: 1x/week  PT DURATION: 8 weeks  PLANNED INTERVENTIONS: 97110-Therapeutic exercises, 97530- Therapeutic activity, 97112- Neuromuscular re-education, 97535- Self Care, 21308- Manual therapy, Taping, Dry Needling, Joint mobilization, Spinal mobilization, Scar mobilization, Cryotherapy, and Moist heat  PLAN FOR NEXT SESSION: continued pelvic floor AROM in seated, introduce knack technique, introduce  lumbopelvic progressive exercises  Omar Person, PT 03/07/2023, 3:22 PM

## 2023-03-14 ENCOUNTER — Ambulatory Visit: Admitting: Physical Therapy

## 2023-03-14 DIAGNOSIS — M6281 Muscle weakness (generalized): Secondary | ICD-10-CM

## 2023-03-14 DIAGNOSIS — R293 Abnormal posture: Secondary | ICD-10-CM | POA: Diagnosis not present

## 2023-03-14 DIAGNOSIS — R279 Unspecified lack of coordination: Secondary | ICD-10-CM | POA: Diagnosis not present

## 2023-03-14 NOTE — Therapy (Signed)
 OUTPATIENT PHYSICAL THERAPY FEMALE PELVIC TREATMENT   Patient Name: Raven Wagner MRN: [id number] DOB:1970-08-02, 53 y.o., female Today's Date: 03/14/2023  END OF SESSION:  PT End of Session - 03/14/23 1136     Visit Number 2    Number of Visits 8    Date for PT Re-Evaluation 04/04/23    Authorization Type BCBS    Progress Note Due on Visit 20    PT Start Time 1100    PT Stop Time 1140    PT Time Calculation (min) 40 min    Activity Tolerance Patient tolerated treatment well    Behavior During Therapy WFL for tasks assessed/performed              Past Medical History:  Diagnosis Date   Cancer (HCC)    Hyperlipidemia    Hypertension    Past Surgical History:  Procedure Laterality Date   ABDOMINAL HYSTERECTOMY     SALPINGOOPHORECTOMY     THYROIDECTOMY     Patient Active Problem List   Diagnosis Date Noted   Obesity 05/27/2019    PCP: Creola Corn, MD   REFERRING PROVIDER: Olivia Mackie, MD   REFERRING DIAG: N39.3 (ICD-10-CM) - Stress incontinence  THERAPY DIAG:  Muscle weakness (generalized)  Unspecified lack of coordination  Abnormal posture  Rationale for Evaluation and Treatment: Rehabilitation  ONSET DATE: 2021  SUBJECTIVE:                                                                                                                                                                                           SUBJECTIVE STATEMENT: "TAR-uh" Patient reports consistency with HEP, breathing exercises have been going well other than one instance when she leaked. Leakage with forward bending is not as bad, but still present. No leakage with a sneeze this past week.  Fluid intake: carries an 18 oz jug, tries to drink 2-4 of these a day; sweet tea or ginger ale or coke zero   PAIN:  Are you having pain? No NPRS scale: 0/10  PRECAUTIONS: None  RED FLAGS: None   WEIGHT BEARING RESTRICTIONS: No  FALLS:  Has patient fallen in last 6 months?  No  OCCUPATION: sits all day at [employer] [place], has a standing desk available   ACTIVITY LEVEL: tries to walk, hasn't been into exercise recently due to bulging disc which has gotten much better   PLOF: Independent  PATIENT GOALS: decrease leakage, strengthen the pelvis and core   PERTINENT HISTORY:  Hx: cancer, hyperlipidemia, hypertension, abdominal hysterectomy, salpingoophorectomy, thyroidectomy  Sexual abuse: No  BOWEL MOVEMENT: Pain with bowel movement: No Type of bowel  movement:Type (Bristol Stool Scale) 3-4, Frequency every other day, Strain yes - has hemorrhoids on and off, and Splinting no Fully empty rectum: Yes:   Leakage: No Pads: No Fiber supplement/laxative Yes - takes a fiber gummy daily to help with constipation due to taking ozempic   URINATION: Pain with urination: No Fully empty bladder: Yes: feels like she could keep peeing even though she is done  Stream: Weak Urgency: Yes  Frequency: 4x/day, only gets up to void at night if she drinks water late  Leakage: Coughing, Sneezing, and Bending forward Pads: Yes: 1x/day  INTERCOURSE: not currently sexually active   Ability to have vaginal penetration No  Pain with intercourse: none DrynessNo Climax: yes Marinoff Scale: 0/3  PREGNANCY: No children   PROLAPSE: None   OBJECTIVE:  Note: Objective measures were completed at Evaluation unless otherwise noted.  PATIENT SURVEYS:  PFIQ-7: 0  COGNITION: Overall cognitive status: Within functional limits for tasks assessed     SENSATION: Light touch: Appears intact  LUMBAR SPECIAL TESTS:  Single leg stance test: Positive  FUNCTIONAL TESTS:  Squat: bilateral dynamic knee valgus, no pain, no leakage   GAIT: Comments: mild trendelenburg gait pattern with ambulation   POSTURE: rounded shoulders, forward head, increased lumbar lordosis, and increased thoracic kyphosis   LUMBARAROM/PROM: within functional limits   A/PROM A/PROM  eval  Flexion    Extension   Right lateral flexion   Left lateral flexion   Right rotation   Left rotation    (Blank rows = not tested)  LOWER EXTREMITY ROM: within normal limits   Active ROM Right eval Left eval  Hip flexion    Hip extension    Hip abduction    Hip adduction    Hip internal rotation    Hip external rotation    Knee flexion    Knee extension    Ankle dorsiflexion    Ankle plantarflexion    Ankle inversion    Ankle eversion     (Blank rows = not tested)  LOWER EXTREMITY MMT: 4/5 bilateral knees and hips grossly   MMT Right eval Left eval  Hip flexion    Hip extension    Hip abduction    Hip adduction    Hip internal rotation    Hip external rotation    Knee flexion    Knee extension    Ankle dorsiflexion    Ankle plantarflexion    Ankle inversion    Ankle eversion     (Blank rows = not tested) PALPATION:   General: increased muscle tension bilateral adductors/hip flexors   Pelvic Alignment: within normal limits   Abdominal: decreased rib excursion with inhalation, abdominal bracing at rest, upper chest breathing at baseline                External Perineal Exam: minimal dryness noted,                              Internal Pelvic Floor: weakness noted throughout with no pain during examination. Patient's pelvic floor AROM is limited due to stiffness/weakness. With internal cueing, patient was able to actively lengthen her pelvic floor with inhalation. No pain following examination.   Patient confirms identification and approves PT to assess internal pelvic floor and treatment Yes  PELVIC MMT:   MMT eval  Vaginal   Internal Anal Sphincter   External Anal Sphincter   Puborectalis   Diastasis Recti   (Blank  rows = not tested)        TONE: Within normal limits   PROLAPSE: None   TODAY'S TREATMENT:                                                                                                                              DATE:   EVAL 03/07/23:  Examination completed, findings reviewed, pt educated on POC, HEP, and self care. Pt motivated to participate in PT and agreeable to attempt recommendations.   Neuro re-ed: Hooklying diaphragmatic breathing + pelvic floor lengthening/shortening with inhalation/exhalation 3x10 breaths  Self care:  Relative anatomy, connection between the pelvic floor and diaphragm, pelvic floor active range of motion, intraabdominal pressure management and the pelvic floor connection  03/14/22:  Neuro re-ed: Seated diaphragmatic breathing + pelvic floor lengthening/shortening with inhalation/exhalation 2x10 breaths  Seated pelvic floor quick flicks + diaphragmatic breathing 2x15  Sit to stand + diaphragmatic breathing 2x10  Bridge + diaphragmatic breathing 2x10  Clamshell + reverse clamshell + diaphragmatic breathing 2x12 each side  Self care:  Relative anatomy, connection between the pelvic floor and diaphragm, pelvic floor active range of motion, intraabdominal pressure management and the pelvic floor connection Knack technique for stress urinary incontinence  PATIENT EDUCATION:  Education details: Relative anatomy, connection between the pelvic floor and diaphragm, pelvic floor active range of motion, intraabdominal pressure management and the pelvic floor connection  Person educated: Patient Education method: Explanation, Demonstration, Tactile cues, Verbal cues, and Handouts Education comprehension: verbalized understanding, returned demonstration, verbal cues required, tactile cues required, and needs further education  HOME EXERCISE PROGRAM: Access Code: [email or web address] URL: [email or web address] Date: 03/14/2023 Prepared by: Earna Coder  Exercises - Seated Pelvic Floor Contraction  - 1 x daily - 7 x weekly - 2 sets - 10 reps - Seated Quick Flick Pelvic Floor Contractions  - 1 x daily - 7 x weekly - 2 sets - 15 reps - Sit to Stand Without Arm Support  - 1 x daily - 7 x weekly - 2 sets - 10  reps - Supine Bridge  - 1 x daily - 7 x weekly - 2 sets - 10 reps - Clamshell  - 1 x daily - 7 x weekly - 2 sets - 10 reps - Sidelying Reverse Clamshell  - 1 x daily - 7 x weekly - 2 sets - 10 reps  ASSESSMENT:  CLINICAL IMPRESSION: Patient is a 53 y.o. female  who was seen today for physical therapy treatment for stress incontinence. Patient has had less urinary leakage at home since implementing pelvic floor exercise training. No leakage with sneezing this week. Knack technique introduced for stress urinary incontinence management and exercises introduced focusing on loading the pelvic floor and controlling diaphragmatic breathing while doing so. Patient had a little leakage with sit to stand exercise today, but no leakage with other exercises. Patient reports feeling good following today's session, no pain to report. Pt would benefit from additional PT to  further address deficits.   OBJECTIVE IMPAIRMENTS: decreased coordination, decreased endurance, decreased ROM, decreased strength, and hypomobility.   ACTIVITY LIMITATIONS: carrying, bending, squatting, and continence  PARTICIPATION LIMITATIONS:  none  PERSONAL FACTORS: Past/current experiences and Time since onset of injury/illness/exacerbation are also affecting patient's functional outcome.   REHAB POTENTIAL: Good  CLINICAL DECISION MAKING: Stable/uncomplicated  EVALUATION COMPLEXITY: Low   GOALS: Goals reviewed with patient? Yes  SHORT TERM GOALS: Target date: 04/04/2023  Pt will be independent with HEP.  Baseline: Goal status: INITIAL  2.  Pt will be independent with diaphragmatic breathing and down training activities in order to improve pelvic floor relaxation. Baseline:  Goal status: INITIAL  3.  Pt will be independent with the knack, urge suppression technique, and double voiding in order to improve bladder habits and decrease urinary incontinence.   Baseline:  Goal status: INITIAL  LONG TERM GOALS: Target date:  09/07/2023  Pt will be independent with advanced HEP.  Baseline:  Goal status: INITIAL  2.  Pt to demonstrate improved coordination of pelvic floor and breathing mechanics with 10# squat with appropriate synergistic patterns to decrease pain and leakage at least 75% of the time.    Baseline:  Goal status: INITIAL  3.  Pt will be able to functional actions such as forward bending/lifting without leakage to improve quality of life. Baseline:  Goal status: INITIAL  4.  Pt will have 75% reduced leakage during a typical day to improve quality of life.  Baseline:  Goal status: INITIAL  PLAN:  PT FREQUENCY: 1x/week  PT DURATION: 8 weeks  PLANNED INTERVENTIONS: 97110-Therapeutic exercises, 97530- Therapeutic activity, 97112- Neuromuscular re-education, 97535- Self Care, 54098- Manual therapy, Taping, Dry Needling, Joint mobilization, Spinal mobilization, Scar mobilization, Cryotherapy, and Moist heat  PLAN FOR NEXT SESSION: continued pelvic floor AROM in seated, introduce knack technique, introduce lumbopelvic progressive exercises  Omar Person, PT 03/14/2023, 11:36 AM

## 2023-03-27 ENCOUNTER — Ambulatory Visit: Admitting: Physical Therapy

## 2023-04-03 ENCOUNTER — Ambulatory Visit: Attending: Obstetrics and Gynecology | Admitting: Physical Therapy

## 2023-04-03 DIAGNOSIS — R293 Abnormal posture: Secondary | ICD-10-CM | POA: Insufficient documentation

## 2023-04-03 DIAGNOSIS — M6281 Muscle weakness (generalized): Secondary | ICD-10-CM | POA: Insufficient documentation

## 2023-04-03 DIAGNOSIS — R279 Unspecified lack of coordination: Secondary | ICD-10-CM | POA: Insufficient documentation

## 2023-04-03 NOTE — Therapy (Signed)
 OUTPATIENT PHYSICAL THERAPY FEMALE PELVIC TREATMENT   Patient Name: Raven Wagner MRN: [id number] DOB:1970/10/28, 53 y.o., female Today's Date: 04/03/2023  END OF SESSION:  PT End of Session - 04/03/23 0923     Visit Number 3    Number of Visits 8    Date for PT Re-Evaluation 04/04/23    Authorization Type BCBS    Progress Note Due on Visit 20    PT Start Time 0848    PT Stop Time 0927    PT Time Calculation (min) 39 min    Activity Tolerance Patient tolerated treatment well    Behavior During Therapy [facility] for tasks assessed/performed               Past Medical History:  Diagnosis Date   Cancer (HCC)    Hyperlipidemia    Hypertension    Past Surgical History:  Procedure Laterality Date   ABDOMINAL HYSTERECTOMY     SALPINGOOPHORECTOMY     THYROIDECTOMY     Patient Active Problem List   Diagnosis Date Noted   Obesity 05/27/2019    PCP: Creola Corn, MD   REFERRING PROVIDER: Olivia Mackie, MD   REFERRING DIAG: N39.3 (ICD-10-CM) - Stress incontinence  THERAPY DIAG:  Muscle weakness (generalized)  Unspecified lack of coordination  Abnormal posture  Rationale for Evaluation and Treatment: Rehabilitation  ONSET DATE: 2021  SUBJECTIVE:                                                                                                                                                                                           SUBJECTIVE STATEMENT: "TAR-uh" Patient got back at 2:45 AM this morning from her flight. She reports that she had to help her husband pick up his suitcase (it was over 30lb) and the combination of bending and lifting caused a small amount of leakage - not large in amount, just a few drops. No other incontinence to report with sneezing/coughing this week.  Fluid intake: carries an 18 oz jug, tries to drink 2-4 of these a day; sweet tea or ginger ale or coke zero   PAIN:  Are you having pain? No NPRS scale: 0/10  PRECAUTIONS: None  RED  FLAGS: None   WEIGHT BEARING RESTRICTIONS: No  FALLS:  Has patient fallen in last 6 months? No  OCCUPATION: sits all day at [employer], has a standing desk available   ACTIVITY LEVEL: tries to walk, hasn't been into exercise recently due to bulging disc which has gotten much better   PLOF: Independent  PATIENT GOALS: decrease leakage, strengthen the pelvis and core   PERTINENT HISTORY:  Hx: cancer, hyperlipidemia, hypertension, abdominal hysterectomy, salpingoophorectomy, thyroidectomy  Sexual abuse: No  BOWEL MOVEMENT: Pain with bowel movement: No Type of bowel movement:Type (Bristol Stool Scale) 3-4, Frequency every other day, Strain yes - has hemorrhoids on and off, and Splinting no Fully empty rectum: Yes:   Leakage: No Pads: No Fiber supplement/laxative Yes - takes a fiber gummy daily to help with constipation due to taking ozempic   URINATION: Pain with urination: No Fully empty bladder: Yes: feels like she could keep peeing even though she is done  Stream: Weak Urgency: Yes  Frequency: 4x/day, only gets up to void at night if she drinks water late  Leakage: Coughing, Sneezing, and Bending forward Pads: Yes: 1x/day  INTERCOURSE: not currently sexually active   Ability to have vaginal penetration No  Pain with intercourse: none DrynessNo Climax: yes Marinoff Scale: 0/3  PREGNANCY: No children   PROLAPSE: None   OBJECTIVE:  Note: Objective measures were completed at Evaluation unless otherwise noted.  PATIENT SURVEYS:  PFIQ-7: 0  COGNITION: Overall cognitive status: Within functional limits for tasks assessed     SENSATION: Light touch: Appears intact  LUMBAR SPECIAL TESTS:  Single leg stance test: Positive  FUNCTIONAL TESTS:  Squat: bilateral dynamic knee valgus, no pain, no leakage   GAIT: Comments: mild trendelenburg gait pattern with ambulation   POSTURE: rounded shoulders, forward head, increased lumbar lordosis, and increased  thoracic kyphosis   LUMBARAROM/PROM: within functional limits   A/PROM A/PROM  eval  Flexion   Extension   Right lateral flexion   Left lateral flexion   Right rotation   Left rotation    (Blank rows = not tested)  LOWER EXTREMITY ROM: within normal limits   Active ROM Right eval Left eval  Hip flexion    Hip extension    Hip abduction    Hip adduction    Hip internal rotation    Hip external rotation    Knee flexion    Knee extension    Ankle dorsiflexion    Ankle plantarflexion    Ankle inversion    Ankle eversion     (Blank rows = not tested)  LOWER EXTREMITY MMT: 4/5 bilateral knees and hips grossly   MMT Right eval Left eval  Hip flexion    Hip extension    Hip abduction    Hip adduction    Hip internal rotation    Hip external rotation    Knee flexion    Knee extension    Ankle dorsiflexion    Ankle plantarflexion    Ankle inversion    Ankle eversion     (Blank rows = not tested) PALPATION:   General: increased muscle tension bilateral adductors/hip flexors   Pelvic Alignment: within normal limits   Abdominal: decreased rib excursion with inhalation, abdominal bracing at rest, upper chest breathing at baseline                External Perineal Exam: minimal dryness noted,                              Internal Pelvic Floor: weakness noted throughout with no pain during examination. Patient's pelvic floor AROM is limited due to stiffness/weakness. With internal cueing, patient was able to actively lengthen her pelvic floor with inhalation. No pain following examination.   Patient confirms identification and approves PT to assess internal pelvic floor and treatment Yes  PELVIC MMT:   MMT  eval  Vaginal   Internal Anal Sphincter   External Anal Sphincter   Puborectalis   Diastasis Recti   (Blank rows = not tested)        TONE: Within normal limits   PROLAPSE: None   TODAY'S TREATMENT:                                                                                                                               DATE:   EVAL 03/07/23: Examination completed, findings reviewed, pt educated on POC, HEP, and self care. Pt motivated to participate in PT and agreeable to attempt recommendations.   Neuro re-ed: Hooklying diaphragmatic breathing + pelvic floor lengthening/shortening with inhalation/exhalation 3x10 breaths  Self care:  Relative anatomy, connection between the pelvic floor and diaphragm, pelvic floor active range of motion, intraabdominal pressure management and the pelvic floor connection  03/14/22:  Neuro re-ed: Seated diaphragmatic breathing + pelvic floor lengthening/shortening with inhalation/exhalation 2x10 breaths  Seated pelvic floor quick flicks + diaphragmatic breathing 2x15  Sit to stand + diaphragmatic breathing 2x10  Bridge + diaphragmatic breathing 2x10  Clamshell + reverse clamshell + diaphragmatic breathing 2x12 each side  Self care:  Relative anatomy, connection between the pelvic floor and diaphragm, pelvic floor active range of motion, intraabdominal pressure management and the pelvic floor connection Knack technique for stress urinary incontinence  04/03/22:  Neuro re-ed: Seated diaphragmatic breathing + pelvic floor lengthening/shortening with inhalation/exhalation 2x10 breaths  Seated pelvic floor quick flicks + diaphragmatic breathing 2x15  Sit to stand + adductor ball squeeze + diaphragmatic breathing 2x10  Bridge + adductor ball squeeze + diaphragmatic breathing 2x10  Sidelying hip abduction + diaphragmatic breathing 2x10 Therapeutic exercise: Lower trunk rotation + diaphragmatic breathing 2x25min Supine pretzel piriformis stretch + diaphragmatic breathing 2x78min  Self care:  Relative anatomy, connection between the pelvic floor and diaphragm, pelvic floor active range of motion, intraabdominal pressure management and the pelvic floor connection Knack technique for stress urinary  incontinence  PATIENT EDUCATION:  Education details: Relative anatomy, connection between the pelvic floor and diaphragm, pelvic floor active range of motion, intraabdominal pressure management and the pelvic floor connection  Person educated: Patient Education method: Explanation, Demonstration, Tactile cues, Verbal cues, and Handouts Education comprehension: verbalized understanding, returned demonstration, verbal cues required, tactile cues required, and needs further education  HOME EXERCISE PROGRAM: Access Code: [email or web address] URL: [email or web address] Date: 04/03/2023 Prepared by: Earna Coder  Exercises - Seated Pelvic Floor Contraction  - 1 x daily - 7 x weekly - 2 sets - 10 reps - Seated Quick Flick Pelvic Floor Contractions  - 1 x daily - 7 x weekly - 2 sets - 15 reps - Sit to Stand with Newman Pies Between Knees  - 1 x daily - 7 x weekly - 2 sets - 10 reps - Supine Bridge with Mini Swiss Ball Between Knees  - 1 x daily - 7 x weekly - 2 sets - 10 reps - Sidelying  Hip Abduction  - 1 x daily - 7 x weekly - 2 sets - 10 reps - Supine Lower Trunk Rotation  - 1 x daily - 7 x weekly - 2 sets - 10 reps - Supine Figure 4 Piriformis Stretch  - 1 x daily - 7 x weekly - 2 sets - 10 reps  ASSESSMENT: CLINICAL IMPRESSION: Patient is a 53 y.o. female  who was seen today for physical therapy treatment for stress incontinence. Patient has only had 1 instance of leakage this week, when she was bending and lifting a heavy suitcase, but No leakage with sneezing this week. Exercises progressed in intensity and patient had no urinary leakage during session. Pretzel piriformis stretch was challenging for the patient due to stiffness in the lumbopelvic region. Patient reports feeling good following today's session, no pain to report. Pt would benefit from additional PT to further address deficits.   OBJECTIVE IMPAIRMENTS: decreased coordination, decreased endurance, decreased ROM, decreased strength,  and hypomobility.   ACTIVITY LIMITATIONS: carrying, bending, squatting, and continence  PARTICIPATION LIMITATIONS:  none  PERSONAL FACTORS: Past/current experiences and Time since onset of injury/illness/exacerbation are also affecting patient's functional outcome.   REHAB POTENTIAL: Good  CLINICAL DECISION MAKING: Stable/uncomplicated  EVALUATION COMPLEXITY: Low   GOALS: Goals reviewed with patient? Yes  SHORT TERM GOALS: Target date: 04/04/2023  Pt will be independent with HEP.  Baseline: Goal status: INITIAL  2.  Pt will be independent with diaphragmatic breathing and down training activities in order to improve pelvic floor relaxation. Baseline:  Goal status: INITIAL  3.  Pt will be independent with the knack, urge suppression technique, and double voiding in order to improve bladder habits and decrease urinary incontinence.   Baseline:  Goal status: INITIAL  LONG TERM GOALS: Target date: 09/07/2023  Pt will be independent with advanced HEP.  Baseline:  Goal status: INITIAL  2.  Pt to demonstrate improved coordination of pelvic floor and breathing mechanics with 10# squat with appropriate synergistic patterns to decrease pain and leakage at least 75% of the time.    Baseline:  Goal status: INITIAL  3.  Pt will be able to functional actions such as forward bending/lifting without leakage to improve quality of life. Baseline:  Goal status: INITIAL  4.  Pt will have 75% reduced leakage during a typical day to improve quality of life.  Baseline:  Goal status: INITIAL  PLAN:  PT FREQUENCY: 1x/week  PT DURATION: 8 weeks  PLANNED INTERVENTIONS: 97110-Therapeutic exercises, 97530- Therapeutic activity, 97112- Neuromuscular re-education, 97535- Self Care, 62952- Manual therapy, Taping, Dry Needling, Joint mobilization, Spinal mobilization, Scar mobilization, Cryotherapy, and Moist heat  PLAN FOR NEXT SESSION: continued pelvic floor AROM in seated, introduce knack  technique, introduce lumbopelvic progressive exercises  Omar Person, PT 04/03/2023, 9:24 AM

## 2023-04-10 ENCOUNTER — Ambulatory Visit: Admitting: Physical Therapy

## 2023-04-10 DIAGNOSIS — E1165 Type 2 diabetes mellitus with hyperglycemia: Secondary | ICD-10-CM | POA: Diagnosis not present

## 2023-04-10 DIAGNOSIS — E785 Hyperlipidemia, unspecified: Secondary | ICD-10-CM | POA: Diagnosis not present

## 2023-04-17 ENCOUNTER — Ambulatory Visit: Admitting: Physical Therapy

## 2023-04-17 DIAGNOSIS — E119 Type 2 diabetes mellitus without complications: Secondary | ICD-10-CM | POA: Diagnosis not present

## 2023-04-17 DIAGNOSIS — R82998 Other abnormal findings in urine: Secondary | ICD-10-CM | POA: Diagnosis not present

## 2023-04-17 DIAGNOSIS — I10 Essential (primary) hypertension: Secondary | ICD-10-CM | POA: Diagnosis not present

## 2023-04-24 ENCOUNTER — Encounter: Admitting: Physical Therapy

## 2023-05-01 ENCOUNTER — Ambulatory Visit: Admitting: Physical Therapy

## 2023-05-01 DIAGNOSIS — R293 Abnormal posture: Secondary | ICD-10-CM

## 2023-05-01 DIAGNOSIS — R279 Unspecified lack of coordination: Secondary | ICD-10-CM | POA: Diagnosis not present

## 2023-05-01 DIAGNOSIS — M6281 Muscle weakness (generalized): Secondary | ICD-10-CM | POA: Diagnosis not present

## 2023-05-01 NOTE — Addendum Note (Signed)
 Addended by: Marni Sins on: 05/01/2023 01:20 PM   Modules accepted: Orders

## 2023-05-01 NOTE — Therapy (Signed)
 OUTPATIENT PHYSICAL THERAPY FEMALE PELVIC TREATMENT   Patient Name: Raven Wagner MRN: [id number] DOB:1970/05/18, 53 y.o., female Today's Date: 05/01/2023  END OF SESSION:  PT End of Session - 05/01/23 1224     Visit Number 4    Number of Visits 8    Date for PT Re-Evaluation 04/04/23    Authorization Type BCBS    Progress Note Due on Visit 20    PT Start Time 1145    PT Stop Time 1230    PT Time Calculation (min) 45 min    Activity Tolerance Patient tolerated treatment well    Behavior During Therapy WFL for tasks assessed/performed                Past Medical History:  Diagnosis Date   Cancer (HCC)    Hyperlipidemia    Hypertension    Past Surgical History:  Procedure Laterality Date   ABDOMINAL HYSTERECTOMY     SALPINGOOPHORECTOMY     THYROIDECTOMY     Patient Active Problem List   Diagnosis Date Noted   Obesity 05/27/2019    PCP: Margarete Sharps, MD   REFERRING PROVIDER: Meriam Stamp, MD   REFERRING DIAG: N39.3 (ICD-10-CM) - Stress incontinence  THERAPY DIAG:  Muscle weakness (generalized)  Unspecified lack of coordination  Abnormal posture  Rationale for Evaluation and Treatment: Rehabilitation  ONSET DATE: 2021  SUBJECTIVE:                                                                                                                                                                                           SUBJECTIVE STATEMENT: "TAR-uh" Patient reports that her urinary incontinence has been much better. She only had one instance of leakage when picking up something heavy (over 50lbs). She is able to pick up her 35lb grandson with no leakage issues.   Fluid intake: carries an 18 oz jug, tries to drink 2-4 of these a day; sweet tea or ginger ale or coke zero   PAIN:  Are you having pain? No NPRS scale: 0/10  PRECAUTIONS: None  RED FLAGS: None   WEIGHT BEARING RESTRICTIONS: No  FALLS:  Has patient fallen in last 6 months?  No  OCCUPATION: sits all day at [employer] [place], has a standing desk available   ACTIVITY LEVEL: tries to walk, hasn't been into exercise recently due to bulging disc which has gotten much better   PLOF: Independent  PATIENT GOALS: decrease leakage, strengthen the pelvis and core   PERTINENT HISTORY:  Hx: cancer, hyperlipidemia, hypertension, abdominal hysterectomy, salpingoophorectomy, thyroidectomy  Sexual abuse: No  BOWEL MOVEMENT: Pain with bowel movement: No  Type of bowel movement:Type (Bristol Stool Scale) 3-4, Frequency every other day, Strain yes - has hemorrhoids on and off, and Splinting no Fully empty rectum: Yes:   Leakage: No Pads: No Fiber supplement/laxative Yes - takes a fiber gummy daily to help with constipation due to taking ozempic   URINATION: Pain with urination: No Fully empty bladder: Yes: feels like she could keep peeing even though she is done  Stream: Weak Urgency: Yes  Frequency: 4x/day, only gets up to void at night if she drinks water late  Leakage: Coughing, Sneezing, and Bending forward Pads: Yes: 1x/day  INTERCOURSE: not currently sexually active   Ability to have vaginal penetration No  Pain with intercourse: none DrynessNo Climax: yes Marinoff Scale: 0/3  PREGNANCY: No children   PROLAPSE: None   OBJECTIVE:  Note: Objective measures were completed at Evaluation unless otherwise noted.  PATIENT SURVEYS:  PFIQ-7: 0  COGNITION: Overall cognitive status: Within functional limits for tasks assessed     SENSATION: Light touch: Appears intact  LUMBAR SPECIAL TESTS:  Single leg stance test: Positive  FUNCTIONAL TESTS:  Squat: bilateral dynamic knee valgus, no pain, no leakage   GAIT: Comments: mild trendelenburg gait pattern with ambulation   POSTURE: rounded shoulders, forward head, increased lumbar lordosis, and increased thoracic kyphosis   LUMBARAROM/PROM: within functional limits   A/PROM A/PROM  eval  Flexion    Extension   Right lateral flexion   Left lateral flexion   Right rotation   Left rotation    (Blank rows = not tested)  LOWER EXTREMITY ROM: within normal limits   Active ROM Right eval Left eval  Hip flexion    Hip extension    Hip abduction    Hip adduction    Hip internal rotation    Hip external rotation    Knee flexion    Knee extension    Ankle dorsiflexion    Ankle plantarflexion    Ankle inversion    Ankle eversion     (Blank rows = not tested)  LOWER EXTREMITY MMT: 4/5 bilateral knees and hips grossly   MMT Right eval Left eval  Hip flexion    Hip extension    Hip abduction    Hip adduction    Hip internal rotation    Hip external rotation    Knee flexion    Knee extension    Ankle dorsiflexion    Ankle plantarflexion    Ankle inversion    Ankle eversion     (Blank rows = not tested) PALPATION:   General: increased muscle tension bilateral adductors/hip flexors   Pelvic Alignment: within normal limits   Abdominal: decreased rib excursion with inhalation, abdominal bracing at rest, upper chest breathing at baseline                External Perineal Exam: minimal dryness noted,                              Internal Pelvic Floor: weakness noted throughout with no pain during examination. Patient's pelvic floor AROM is limited due to stiffness/weakness. With internal cueing, patient was able to actively lengthen her pelvic floor with inhalation. No pain following examination.   Patient confirms identification and approves PT to assess internal pelvic floor and treatment Yes  PELVIC MMT:   MMT eval  Vaginal   Internal Anal Sphincter   External Anal Sphincter   Puborectalis   Diastasis Recti   (  Blank rows = not tested)        TONE: Within normal limits   PROLAPSE: None   TODAY'S TREATMENT:                                                                                                                              DATE:   03/14/22:  Neuro  re-ed: Seated diaphragmatic breathing + pelvic floor lengthening/shortening with inhalation/exhalation 2x10 breaths  Seated pelvic floor quick flicks + diaphragmatic breathing 2x15  Sit to stand + diaphragmatic breathing 2x10  Bridge + diaphragmatic breathing 2x10  Clamshell + reverse clamshell + diaphragmatic breathing 2x12 each side  Self care:  Relative anatomy, connection between the pelvic floor and diaphragm, pelvic floor active range of motion, intraabdominal pressure management and the pelvic floor connection Knack technique for stress urinary incontinence  04/03/22:  Neuro re-ed: Seated diaphragmatic breathing + pelvic floor lengthening/shortening with inhalation/exhalation 2x10 breaths  Seated pelvic floor quick flicks + diaphragmatic breathing 2x15  Sit to stand + adductor ball squeeze + diaphragmatic breathing 2x10  Bridge + adductor ball squeeze + diaphragmatic breathing 2x10  Sidelying hip abduction + diaphragmatic breathing 2x10 Therapeutic exercise: Lower trunk rotation + diaphragmatic breathing 2x37min Supine pretzel piriformis stretch + diaphragmatic breathing 2x80min  Self care:  Relative anatomy, connection between the pelvic floor and diaphragm, pelvic floor active range of motion, intraabdominal pressure management and the pelvic floor connection Knack technique for stress urinary incontinence  05/01/23: Neuro re-ed: Seated diaphragmatic breathing + pelvic floor lengthening/shortening with inhalation/exhalation 2x10 breaths  Seated pelvic floor quick flicks + diaphragmatic breathing 2x15  Squat + diaphragmatic breathing 2x12 Standing hip 3 way (YTB) + diaphragmatic breathing 2x10 each   Standing pull down (RTB) + single leg march + diaphragmatic breathing 2x10 each  Bird dog + diaphragmatic breathing 2x10 alternating  Therapeutic exercise: Lower trunk rotation + diaphragmatic breathing 2x79min Supine pretzel piriformis stretch + diaphragmatic breathing 2x36min   Single knee to chest stretch + diaphragmatic breathing 2x66min each  Standing hamstring stretch + diaphragmatic breathing 2x35min  Self care:  Relative anatomy, connection between the pelvic floor and diaphragm, pelvic floor active range of motion, intraabdominal pressure management and the pelvic floor connection Knack technique for stress urinary incontinence  PATIENT EDUCATION:  Education details: Relative anatomy, connection between the pelvic floor and diaphragm, pelvic floor active range of motion, intraabdominal pressure management and the pelvic floor connection  Person educated: Patient Education method: Explanation, Demonstration, Tactile cues, Verbal cues, and Handouts Education comprehension: verbalized understanding, returned demonstration, verbal cues required, tactile cues required, and needs further education  HOME EXERCISE PROGRAM: Access Code: [email or web address] URL: [email or web address] Date: 05/01/2023 Prepared by: Robbin Chill  Exercises - Seated Pelvic Floor Contraction  - 1 x daily - 7 x weekly - 2 sets - 10 reps - Seated Quick Flick Pelvic Floor Contractions  - 1 x daily - 7 x weekly - 2 sets - 15 reps - Standing 3-Way Leg Reach  with Resistance at Ankles and Counter Support  - 1 x daily - 7 x weekly - 2 sets - 10 reps - Squat  - 1 x daily - 7 x weekly - 2 sets - 10 reps - Bird Dog  - 1 x daily - 7 x weekly - 2 sets - 10 reps - Resistance Pulldown with March  - 1 x daily - 7 x weekly - 2 sets - 10 reps - Supine Lower Trunk Rotation  - 1 x daily - 7 x weekly - 2 sets - 10 reps - Supine Figure 4 Piriformis Stretch  - 1 x daily - 7 x weekly - 2 sets - hold - Supine Single Knee to Chest Stretch  - 1 x daily - 7 x weekly - 2 sets - hold  ASSESSMENT: CLINICAL IMPRESSION: Patient is a 53 y.o. female  who was seen today for physical therapy treatment for stress incontinence. Patient has only had 1 instance of leakage this week, when she tried to pick up a  50lb box without knowing it was that heavy. Exercises progressed in intensity and patient had no urinary leakage during session. Traditional squat progression was challenging on the patient's knees, but she did not have any leakage with the progression. Patient reports feeling good following today's session, no pain to report. Pt would benefit from additional PT to further address deficits.   OBJECTIVE IMPAIRMENTS: decreased coordination, decreased endurance, decreased ROM, decreased strength, and hypomobility.   ACTIVITY LIMITATIONS: carrying, bending, squatting, and continence  PARTICIPATION LIMITATIONS:  none  PERSONAL FACTORS: Past/current experiences and Time since onset of injury/illness/exacerbation are also affecting patient's functional outcome.   REHAB POTENTIAL: Good  CLINICAL DECISION MAKING: Stable/uncomplicated  EVALUATION COMPLEXITY: Low   GOALS: Goals reviewed with patient? Yes  SHORT TERM GOALS: Target date: 04/04/2023  Pt will be independent with HEP.  Baseline: Goal status: INITIAL  2.  Pt will be independent with diaphragmatic breathing and down training activities in order to improve pelvic floor relaxation. Baseline:  Goal status: INITIAL  3.  Pt will be independent with the knack, urge suppression technique, and double voiding in order to improve bladder habits and decrease urinary incontinence.   Baseline:  Goal status: INITIAL  LONG TERM GOALS: Target date: 09/07/2023  Pt will be independent with advanced HEP.  Baseline:  Goal status: INITIAL  2.  Pt to demonstrate improved coordination of pelvic floor and breathing mechanics with 10# squat with appropriate synergistic patterns to decrease pain and leakage at least 75% of the time.    Baseline:  Goal status: INITIAL  3.  Pt will be able to functional actions such as forward bending/lifting without leakage to improve quality of life. Baseline:  Goal status: INITIAL  4.  Pt will have 75% reduced  leakage during a typical day to improve quality of life.  Baseline:  Goal status: INITIAL  PLAN:  PT FREQUENCY: 1x/week  PT DURATION: 8 weeks  PLANNED INTERVENTIONS: 97110-Therapeutic exercises, 97530- Therapeutic activity, 97112- Neuromuscular re-education, 97535- Self Care, [id number]- Manual therapy, Taping, Dry Needling, Joint mobilization, Spinal mobilization, Scar mobilization, Cryotherapy, and Moist heat  PLAN FOR NEXT SESSION: continued pelvic floor AROM in seated, introduce knack technique, introduce lumbopelvic progressive exercises  Marni Sins, PT 05/01/2023, 12:25 PM

## 2023-05-03 DIAGNOSIS — Z1339 Encounter for screening examination for other mental health and behavioral disorders: Secondary | ICD-10-CM | POA: Diagnosis not present

## 2023-05-03 DIAGNOSIS — E119 Type 2 diabetes mellitus without complications: Secondary | ICD-10-CM | POA: Diagnosis not present

## 2023-05-03 DIAGNOSIS — Z1331 Encounter for screening for depression: Secondary | ICD-10-CM | POA: Diagnosis not present

## 2023-05-03 DIAGNOSIS — Z0000 Encounter for general adult medical examination without abnormal findings: Secondary | ICD-10-CM | POA: Diagnosis not present

## 2023-05-20 DIAGNOSIS — Z1211 Encounter for screening for malignant neoplasm of colon: Secondary | ICD-10-CM | POA: Diagnosis not present

## 2023-05-26 LAB — COLOGUARD: COLOGUARD: POSITIVE — AB

## 2023-06-19 DIAGNOSIS — L731 Pseudofolliculitis barbae: Secondary | ICD-10-CM | POA: Diagnosis not present

## 2023-06-19 DIAGNOSIS — L720 Epidermal cyst: Secondary | ICD-10-CM | POA: Diagnosis not present

## 2023-08-07 ENCOUNTER — Ambulatory Visit (AMBULATORY_SURGERY_CENTER)

## 2023-08-07 ENCOUNTER — Encounter: Payer: Self-pay | Admitting: Internal Medicine

## 2023-08-07 VITALS — Ht 63.75 in | Wt 186.0 lb

## 2023-08-07 DIAGNOSIS — Z1211 Encounter for screening for malignant neoplasm of colon: Secondary | ICD-10-CM

## 2023-08-07 MED ORDER — NA SULFATE-K SULFATE-MG SULF 17.5-3.13-1.6 GM/177ML PO SOLN: 1.0000 | Freq: Once | ORAL | 0 refills | Status: AC

## 2023-08-07 NOTE — Patient Instructions (Signed)
 ONCE A WEEK INJECTIONS Ozempic,  Mounjaro, Wegovy, Trulicity, Tanzeum, Byetta, Victoza, Bydureon, & SymlinPen  -DO NOT TAKE 7 days prior to the procedure.  Last dose on or before Monday 8/18 failure to hold this medication will result in a cancellation or rescheduling of your procedure

## 2023-08-07 NOTE — Progress Notes (Signed)

## 2023-08-20 DIAGNOSIS — E119 Type 2 diabetes mellitus without complications: Secondary | ICD-10-CM | POA: Diagnosis not present

## 2023-08-28 ENCOUNTER — Encounter: Payer: Self-pay | Admitting: Internal Medicine

## 2023-08-28 ENCOUNTER — Ambulatory Visit: Admitting: Internal Medicine

## 2023-08-28 VITALS — BP 120/72 | HR 75 | Temp 97.1°F | Resp 20 | Ht 63.75 in | Wt 186.0 lb

## 2023-08-28 DIAGNOSIS — Z1211 Encounter for screening for malignant neoplasm of colon: Secondary | ICD-10-CM | POA: Diagnosis not present

## 2023-08-28 DIAGNOSIS — K5730 Diverticulosis of large intestine without perforation or abscess without bleeding: Secondary | ICD-10-CM | POA: Diagnosis not present

## 2023-08-28 DIAGNOSIS — D120 Benign neoplasm of cecum: Secondary | ICD-10-CM

## 2023-08-28 DIAGNOSIS — R195 Other fecal abnormalities: Secondary | ICD-10-CM

## 2023-08-28 DIAGNOSIS — K648 Other hemorrhoids: Secondary | ICD-10-CM | POA: Diagnosis not present

## 2023-08-28 DIAGNOSIS — K635 Polyp of colon: Secondary | ICD-10-CM

## 2023-08-28 DIAGNOSIS — D122 Benign neoplasm of ascending colon: Secondary | ICD-10-CM | POA: Diagnosis not present

## 2023-08-28 MED ORDER — SODIUM CHLORIDE 0.9 % IV SOLN: 500.0000 mL | Freq: Once | INTRAVENOUS | Status: DC

## 2023-08-28 NOTE — Progress Notes (Signed)
 HISTORY OF PRESENT ILLNESS:  Raven Wagner is a 53 y.o. female sent directly for colonoscopy after testing positive for Cologuard.  No complaints.  Grandparent with colon cancer  REVIEW OF SYSTEMS:  All non-GI ROS negative except for  Past Medical History:  Diagnosis Date   Cancer (HCC)    Hyperlipidemia    Hypertension     Past Surgical History:  Procedure Laterality Date   ABDOMINAL HYSTERECTOMY     SALPINGOOPHORECTOMY     THYROIDECTOMY      Social History Raven Wagner  reports that she has never smoked. She has never used smokeless tobacco. She reports current alcohol  use. She reports that she does not currently use drugs after having used the following drugs: Marijuana.  family history includes Colon cancer in her paternal grandfather.  Allergies  Allergen Reactions   Promethazine Shortness Of Breath and Other (See Comments)    Phenergan  Other reaction(s): SOB, chest heaviness   Bismuth Subsalicylate Other (See Comments)    Black Tongue  Pepto-bismol   Simvastatin Rash and Dermatitis    Other reaction(s): red rash       PHYSICAL EXAMINATION: Vital signs: BP 135/84   Pulse 78   Temp (!) 97.1 F (36.2 C) (Temporal)   Resp 13   Ht 5' 3.75 (1.619 m)   Wt 186 lb (84.4 kg)   SpO2 99%   BMI 32.18 kg/m  General: Well-developed, well-nourished, no acute distress HEENT: Sclerae are anicteric, conjunctiva pink. Oral mucosa intact Lungs: Clear Heart: Regular Abdomen: soft, nontender, nondistended, no obvious ascites, no peritoneal signs, normal bowel sounds. No organomegaly. Extremities: No edema Psychiatric: alert and oriented x3. Cooperative     ASSESSMENT:  Positive Cologuard testing Colon cancer screening  PLAN:    Colonoscopy

## 2023-08-28 NOTE — Op Note (Signed)
 [facility] Patient Name: Raven Wagner Procedure Date: 08/28/2023 11:32 AM MRN: [id number] Endoscopist: Norleen SAILOR. Abran , MD, [id number] Age: 53 Referring MD:  Date of Birth: 1970/02/12 Gender: Female Account #: [id number] Procedure:                Colonoscopy with cold snare polypectomy x 3; hot x                            1; biopsy polypectomy x 1 Indications:              Screening for colorectal malignant neoplasm.                            Positive Cologuard testing Medicines:                Monitored Anesthesia Care Procedure:                Pre-Anesthesia Assessment:                           - Prior to the procedure, a History and Physical                            was performed, and patient medications and                            allergies were reviewed. The patient's tolerance of                            previous anesthesia was also reviewed. The risks                            and benefits of the procedure and the sedation                            options and risks were discussed with the patient.                            All questions were answered, and informed consent                            was obtained. Prior Anticoagulants: The patient has                            taken no anticoagulant or antiplatelet agents. ASA                            Grade Assessment: II - A patient with mild systemic                            disease. After reviewing the risks and benefits,                            the patient was deemed in satisfactory condition to  undergo the procedure.                           After obtaining informed consent, the colonoscope                            was passed under direct vision. Throughout the                            procedure, the patient's blood pressure, pulse, and                            oxygen saturations were monitored continuously. The                            CF [id number] #[id number] was  introduced through the anus                            and advanced to the the cecum, identified by                            appendiceal orifice and ileocecal valve. The                            ileocecal valve, appendiceal orifice, and rectum                            were photographed. The quality of the bowel                            preparation was excellent. The colonoscopy was                            performed without difficulty. The patient tolerated                            the procedure well. The bowel preparation used was                            SUPREP via split dose instruction. Scope In: 11:43:00 AM Scope Out: 12:01:00 PM Scope Withdrawal Time: 0 hours 15 minutes 46 seconds  Total Procedure Duration: 0 hours 18 minutes 0 seconds  Findings:                 A less than 1 mm polyp was found in the cecum. The                            polyp was removed with a jumbo cold forceps.                            Resection and retrieval were complete.                           A 10 mm polyp was found in the ascending colon.  The                            polyp was pedunculated. The polyp was removed with                            a hot snare. Resection and retrieval were complete.                           Three polyps were found in the ascending colon. The                            polyps were 2 to 4 mm in size. These polyps were                            removed with a cold snare. Resection and retrieval                            were complete.                           Multiple diverticula were found in the entire colon.                           Internal hemorrhoids were found during retroflexion.                           The exam was otherwise without abnormality on                            direct and retroflexion views. Complications:            No immediate complications. Estimated blood loss:                            None. Estimated Blood Loss:      Estimated blood loss: none. Impression:               - One less than 1 mm polyp in the cecum, removed                            with a jumbo cold forceps. Resected and retrieved.                           - One 10 mm polyp in the ascending colon, removed                            with a hot snare. Resected and retrieved.                           - Three 2 to 4 mm polyps in the ascending colon,                            removed with a cold snare. Resected and retrieved.                           -  Diverticulosis in the entire examined colon.                           - Internal hemorrhoids.                           - The examination was otherwise normal on direct                            and retroflexion views. Recommendation:           - Repeat colonoscopy in 3 years for surveillance.                           - Patient has a contact number available for                            emergencies. The signs and symptoms of potential                            delayed complications were discussed with the                            patient. Return to normal activities tomorrow.                            Written discharge instructions were provided to the                            patient.                           - Resume previous diet.                           - Continue present medications.                           - Await pathology results. Norleen SAILOR. Abran, MD 08/28/2023 12:10:02 PM This report has been signed electronically.

## 2023-08-28 NOTE — Patient Instructions (Addendum)
 Resume previous diet and medications.  Biopsy results will be sent via MyChart or letter.  Repeat colonoscopy in 3 years.  Handout provided on polyps.   YOU HAD AN ENDOSCOPIC PROCEDURE TODAY AT THE [facility]:   Refer to the procedure report that was given to you for any specific questions about what was found during the examination.  If the procedure report does not answer your questions, please call your gastroenterologist to clarify.  If you requested that your care partner not be given the details of your procedure findings, then the procedure report has been included in a sealed envelope for you to review at your convenience later.  YOU SHOULD EXPECT: Some feelings of bloating in the abdomen. Passage of more gas than usual.  Walking can help get rid of the air that was put into your GI tract during the procedure and reduce the bloating. If you had a lower endoscopy (such as a colonoscopy or flexible sigmoidoscopy) you may notice spotting of blood in your stool or on the toilet paper. If you underwent a bowel prep for your procedure, you may not have a normal bowel movement for a few days.  Please Note:  You might notice some irritation and congestion in your nose or some drainage.  This is from the oxygen used during your procedure.  There is no need for concern and it should clear up in a day or so.  SYMPTOMS TO REPORT IMMEDIATELY:  Following lower endoscopy (colonoscopy or flexible sigmoidoscopy):  Excessive amounts of blood in the stool  Significant tenderness or worsening of abdominal pains  Swelling of the abdomen that is new, acute  Fever of 100F or higher  For urgent or emergent issues, a gastroenterologist can be reached at any hour by calling [phone]. Do not use MyChart messaging for urgent concerns.    DIET:  We do recommend a small meal at first, but then you may proceed to your regular diet.  Drink plenty of fluids but you should avoid alcoholic beverages  for 24 hours.  ACTIVITY:  You should plan to take it easy for the rest of today and you should NOT DRIVE or use heavy machinery until tomorrow (because of the sedation medicines used during the test).    FOLLOW UP: Our staff will call the number listed on your records the next business day following your procedure.  We will call around 7:15- 8:00 am to check on you and address any questions or concerns that you may have regarding the information given to you following your procedure. If we do not reach you, we will leave a message.     If any biopsies were taken you will be contacted by phone or by letter within the next 1-3 weeks.  Please call us  at [phone] if you have not heard about the biopsies in 3 weeks.    SIGNATURES/CONFIDENTIALITY: You and/or your care partner have signed paperwork which will be entered into your electronic medical record.  These signatures attest to the fact that that the information above on your After Visit Summary has been reviewed and is understood.  Full responsibility of the confidentiality of this discharge information lies with you and/or your care-partner.

## 2023-08-28 NOTE — Progress Notes (Signed)
 Called to room to assist during endoscopic procedure.  Patient ID and intended procedure confirmed with present staff. Received instructions for my participation in the procedure from the performing physician.

## 2023-08-28 NOTE — Progress Notes (Signed)
 Sedate, gd SR, tolerated procedure well, VSS, report to RN

## 2023-08-28 NOTE — Progress Notes (Signed)
 Pt's states no medical or surgical changes since previsit or office visit.

## 2023-08-29 ENCOUNTER — Other Ambulatory Visit: Payer: Self-pay | Admitting: Medical Genetics

## 2023-08-29 ENCOUNTER — Telehealth: Payer: Self-pay | Admitting: *Deleted

## 2023-08-29 NOTE — Telephone Encounter (Signed)
  Follow up Call-     08/28/2023   10:49 AM  Call back number  Post procedure Call Back phone  # [phone]  Permission to leave phone message Yes     Patient questions:  Do you have a fever, pain , or abdominal swelling? No. Pain Score  0 *  Have you tolerated food without any problems? Yes.    Have you been able to return to your normal activities? Yes.    Do you have any questions about your discharge instructions: Diet   No. Medications  No. Follow up visit  No.  Do you have questions or concerns about your Care? Yes.    Actions: * If pain score is 4 or above: No action needed, pain <4.

## 2023-08-30 ENCOUNTER — Ambulatory Visit: Payer: Self-pay | Admitting: Internal Medicine

## 2023-08-30 LAB — SURGICAL PATHOLOGY

## 2023-10-04 ENCOUNTER — Other Ambulatory Visit (HOSPITAL_COMMUNITY)
Admission: RE | Admit: 2023-10-04 | Discharge: 2023-10-04 | Disposition: A | Discharge status: Home / Self Care | Payer: Self-pay | Source: Ambulatory Visit | Attending: Medical Genetics | Admitting: Medical Genetics

## 2023-10-14 LAB — GENECONNECT MOLECULAR SCREEN: Genetic Analysis Overall Interpretation: NEGATIVE

## 2023-10-29 DIAGNOSIS — C73 Malignant neoplasm of thyroid gland: Secondary | ICD-10-CM | POA: Diagnosis not present

## 2023-10-29 DIAGNOSIS — E119 Type 2 diabetes mellitus without complications: Secondary | ICD-10-CM | POA: Diagnosis not present

## 2023-10-29 DIAGNOSIS — Z23 Encounter for immunization: Secondary | ICD-10-CM | POA: Diagnosis not present
